# Patient Record
Sex: Female | Born: 1940 | Race: White | Hispanic: No | Marital: Married | Smoking: Former smoker
Health system: Southern US, Community
[De-identification: ages and names within clinical notes are randomized; demographics above are authoritative.]

## PROBLEM LIST (undated history)

## (undated) DIAGNOSIS — E7800 Pure hypercholesterolemia, unspecified: Secondary | ICD-10-CM

## (undated) DIAGNOSIS — H409 Unspecified glaucoma: Secondary | ICD-10-CM

## (undated) DIAGNOSIS — G20 Parkinson's disease: Secondary | ICD-10-CM

## (undated) DIAGNOSIS — N3281 Overactive bladder: Secondary | ICD-10-CM

## (undated) DIAGNOSIS — M1990 Unspecified osteoarthritis, unspecified site: Secondary | ICD-10-CM

## (undated) DIAGNOSIS — R251 Tremor, unspecified: Secondary | ICD-10-CM

## (undated) HISTORY — DX: Parkinson's disease: G20

## (undated) HISTORY — PX: EYE SURGERY: SHX253

## (undated) HISTORY — DX: Unspecified glaucoma: H40.9

## (undated) HISTORY — PX: CATARACT EXTRACTION, BILATERAL: SHX1313

## (undated) HISTORY — DX: Pure hypercholesterolemia, unspecified: E78.00

## (undated) HISTORY — PX: COSMETIC SURGERY: SHX468

## (undated) HISTORY — PX: OTHER SURGICAL HISTORY: SHX169

## (undated) HISTORY — PX: TUBAL LIGATION: SHX77

---

## 2018-05-04 ENCOUNTER — Telehealth: Payer: Self-pay | Admitting: Neurology

## 2018-05-04 ENCOUNTER — Other Ambulatory Visit: Payer: Self-pay

## 2018-05-04 ENCOUNTER — Ambulatory Visit (INDEPENDENT_AMBULATORY_CARE_PROVIDER_SITE_OTHER): Payer: Medicare Other | Admitting: Neurology

## 2018-05-04 ENCOUNTER — Encounter: Payer: Self-pay | Admitting: Neurology

## 2018-05-04 DIAGNOSIS — G20 Parkinson's disease: Secondary | ICD-10-CM | POA: Diagnosis not present

## 2018-05-04 MED ORDER — PRAMIPEXOLE DIHYDROCHLORIDE 0.125 MG PO TABS: ORAL_TABLET | ORAL | 2 refills | Status: DC

## 2018-05-04 NOTE — Patient Instructions (Signed)
We will start mirapex for the parkinson's disease.  Mirapex (pramipexole) may result in confusion or hallucinations, dizziness, drowsiness, or nausea. If any significant side effects are noted, please contact our office.

## 2018-05-04 NOTE — Progress Notes (Signed)
Reason for visit: Tremor  Referring physician: Dr. Marylyn Ishiharaesai  Jerrell Shelley Espinoza is a 77 y.o. female  History of present illness:  Ms. Shelley Espinoza is a 77 year old right-handed white female with a history of a tremor that just started after 28 Mar 2018.  The patient had been under some stress with a recent move and an illness of her husband.  The patient fell on 24 April 2018 as she was coming downhill, she felt as if her body was ahead of her feet and she could not catch up.  The patient bruised the left periorbital area and the left knee.  The patient did not undergo a CT scan of the head following the fall.  She has noted some problems with micrographia and sloppy handwriting since March 2019.  The patient denies any tremor on the left arm.  The right arm tremor is associated with resting, and goes away when she uses the arm.  She reports no numbness or weakness of the extremities.  She denies any problems with her speech or swallowing.  She does have a history of benign positional vertigo that may come and go over time.  She goes on to say that her brother has Parkinson's disease and dementia.  She is sent to this office for an evaluation.  Past Medical History:  Diagnosis Date  . High cholesterol     Past Surgical History:  Procedure Laterality Date  . CATARACT EXTRACTION, BILATERAL     age 77  . martins nueroma     feet    Family History  Problem Relation Age of Onset  . Stroke Mother   . Hypertension Father   . Heart disease Father   . Schizophrenia Sister   . Parkinsonism Brother     Social history:  reports that she has never smoked. She has never used smokeless tobacco. She reports that she drinks alcohol. She reports that she does not use drugs.  Medications:  Prior to Admission medications   Medication Sig Start Date End Date Taking? Authorizing Provider  Ascorbic Acid (SUPER C COMPLEX PO) Take 500 mg by mouth daily.   Yes [provider]  BIOTIN PO Take 5,000 mg  by mouth daily.   Yes [provider]  CALCIUM-VITAMIN D PO Take 2 tablets by mouth daily.   Yes [provider]  Cholecalciferol (VITAMIN D3 PO) Take 2,000 Units by mouth daily.   Yes [provider]  Cyanocobalamin (B-12 PO) Take 1,000 mcg by mouth daily.   Yes [provider]  fluticasone (CUTIVATE) 0.05 % cream Apply 1 application topically 2 (two) times daily as needed. 04/17/18  Yes [provider]  GLUCOSAMINE HCL PO Take 1,500 mg by mouth daily.   Yes [provider]  GLUCOSAMINE-FISH OIL-EPA-DHA PO Take 1 tablet by mouth daily. Fish oil 180mg , EPA 120mg , DHA   Yes [provider]  meloxicam (MOBIC) 15 MG tablet Take 7.5 mg by mouth daily.   Yes [provider]  Multiple Vitamins-Minerals (MULTIVITAMIN PO) Take 1 tablet by mouth daily.   Yes [provider]  psyllium (METAMUCIL) 58.6 % powder Take 1 packet by mouth as needed.   Yes [provider]  simvastatin (ZOCOR) 20 MG tablet Take 1 tablet by mouth every other day. 04/13/18  Yes [provider]  UNABLE TO FIND Take 1 Dose by mouth daily. Med Name: pro advanced formula 220   Yes [provider]  pramipexole (MIRAPEX) 0.125 MG tablet One tablet  three times a day for 2 weeks, then take 2 tablets three times a day 05/04/18   York Spaniel, MD     Not on File  ROS:  Out of a complete 14 system review of symptoms, the patient complains only of the following symptoms, and all other reviewed systems are negative.  Chills, fatigue Frequent waking, daytime sleepiness Incontinence of the bladder, frequency of urination, urinary urgency Walking difficulty Dizziness, tremors Confusion, decreased concentration, anxiety  Blood pressure 123/66, pulse 72, height 5\' 4"  (1.626 m), weight 167 lb (75.8 kg).  Physical Exam  General: The patient is alert and cooperative at the time of the examination.  Eyes: Pupils are equal, round, and  reactive to light. Discs are flat bilaterally.  Neck: The neck is supple, no carotid bruits are noted.  Respiratory: The respiratory examination is clear.  Cardiovascular: The cardiovascular examination reveals a regular rate and rhythm, no obvious murmurs or rubs are noted.  Skin: Extremities are without significant edema.  Ecchymosis around the left knee is seen.  Neurologic Exam  Mental status: The patient is alert and oriented x 3 at the time of the examination. The patient has apparent normal recent and remote memory, with an apparently normal attention span and concentration ability.  Cranial nerves: Facial symmetry is present. There is good sensation of the face to pinprick and soft touch bilaterally. The strength of the facial muscles and the muscles to head turning and shoulder shrug are normal bilaterally. Speech is well enunciated, no aphasia or dysarthria is noted. Extraocular movements are full. Visual fields are full. The tongue is midline, and the patient has symmetric elevation of the soft palate. No obvious hearing deficits are noted.  Ecchymosis around the left eye is seen.  Motor: The motor testing reveals 5 over 5 strength of all 4 extremities. Good symmetric motor tone is noted throughout.  Sensory: Sensory testing is intact to pinprick, soft touch, vibration sensation, and position sense on all 4 extremities. No evidence of extinction is noted.  Coordination: Cerebellar testing reveals good finger-nose-finger and heel-to-shin bilaterally.  A resting tremor involving the right upper extremity is seen.  Gait and station: The patient is able to stand from a sitting position with arms crossed.  Once up, the patient can walk independently with good stride and good turns.  There is decreased arm swing on the right.  Tandem gait is normal. Romberg is negative. No drift is seen.  Reflexes: Deep tendon reflexes are symmetric and normal bilaterally. Toes are downgoing  bilaterally.   Assessment/Plan:  1.  Parkinson's disease, right-sided features  The patient may have an inherited form of Parkinson's disease as her brother also has Parkinson's disease and dementia.  The patient had a recent fall with head trauma.  She will undergo MRI of the brain.  She will be placed on Mirapex beginning at 0.125 mg 3 times daily for 2 weeks and then go to 0.25 mg 3 times daily.  I will see her back in 5 months.  She is to remain active with exercise and walking.  Marlan Palau MD 05/04/2018 2:37 PM  [facility] [address] [place], [place] [address]  Phone [phone] Fax [phone]

## 2018-05-04 NOTE — Telephone Encounter (Signed)
[place]icare/[place] of [place] order sent to [facility]. They will reach out to the pt to schedule.  °

## 2018-05-06 ENCOUNTER — Encounter: Payer: Self-pay | Admitting: *Deleted

## 2018-05-06 ENCOUNTER — Telehealth: Payer: Self-pay | Admitting: Neurology

## 2018-05-06 NOTE — Telephone Encounter (Signed)
Called husband, Roe [place]Don ([place] back. He wanted to know if they should space mirapex dosing evenly throughout the day. He knows she needs to take 1 tablet TID for 2 weeks and then increase to 2 tabs there on after. I verified she should evenly space throughout the day. She gets up around 8am per husband.  Husband states wife has one alcoholic drink per night and wanted to make sure it was ok for her to continue this since adding mirapex to medication regimen. Advised I will verify with Dr. Anne HahnWillis and we will only call if she should not continue to have one alcoholic drink per night. If they do not hear from us, it is ok for her to continue. He verbalized understanding.

## 2018-05-06 NOTE — Telephone Encounter (Signed)
I called the patient.  I talk with husband.  The use of small amounts of alcohol is okay with the Mirapex, they can space the medications out to the day, do not have to be extremely specific about when the timing is when the medication is taken.

## 2018-05-06 NOTE — Telephone Encounter (Signed)
Pt husband Roe CoombsDon requesting a call stating he has a few question regarding medication pramipexole (MIRAPEX) 0.125 MG tablet. Roe CoombsDon did not wish to discuss with me, please call to advise

## 2018-05-18 ENCOUNTER — Ambulatory Visit
Admission: RE | Admit: 2018-05-18 | Discharge: 2018-05-18 | Disposition: A | Discharge status: Home / Self Care | Payer: Medicare Other | Source: Ambulatory Visit | Attending: Neurology | Admitting: Neurology

## 2018-05-18 DIAGNOSIS — G20 Parkinson's disease: Secondary | ICD-10-CM

## 2018-05-19 ENCOUNTER — Telehealth: Payer: Self-pay | Admitting: *Deleted

## 2018-05-19 NOTE — Telephone Encounter (Signed)
Called and spoke with patient about MRI results per Dr. Marjory LiesPenumalli note. Pt verbalized understanding. She will continue mirapex and keep upcoming f/u. She will call back if she has further questions/concerns.

## 2018-05-19 NOTE — Telephone Encounter (Signed)
-----   Message from Suanne MarkerVikram R Penumalli, MD sent at 05/19/2018 10:28 AM EDT ----- Unremarkable imaging results. Small spots of scar tissue. No other major findings. Please call patient. Continue current plan. -VRP

## 2018-06-08 ENCOUNTER — Telehealth: Payer: Self-pay | Admitting: Neurology

## 2018-06-08 NOTE — Telephone Encounter (Signed)
The patient has chronic constipation.  She is to go on docusate as a stool softener but then begin taking MiraLAX on a daily basis.  She may take Dulcolax if needed.

## 2018-06-08 NOTE — Telephone Encounter (Signed)
Pt is constipated and wants to know what she can take. Please call to advise

## 2018-08-10 ENCOUNTER — Other Ambulatory Visit: Payer: Self-pay | Admitting: Neurology

## 2018-08-26 ENCOUNTER — Telehealth: Payer: Self-pay | Admitting: Neurology

## 2018-08-26 NOTE — Telephone Encounter (Signed)
I called the patient.  The patient is having an increase in the tremors recently, the Mirapex will not help the tremor.  I have recommended that she take Benadryl at night to help her rest and to suppress the tremor, she may take 25 or 50 mg at night.

## 2018-08-26 NOTE — Telephone Encounter (Signed)
Spoke with Shelley Espinoza and confirmed that she is taking Mirapex, 2 tabs tid as rx'd.  Sts. right arm tremors are worse in the last 7-10 days with no obvious cause.  Tremors were so bad last night they kept her from sleeping. O/W she is feeling overall better--walking 3 times a wk, then going to the gym 3 times a wk as well/fim

## 2018-08-26 NOTE — Telephone Encounter (Signed)
Pt requesting a call stating for about 2 weeks her hand has gotten worse (uncontrolable shaking) stating last night it kept her awake all night long. Please advise

## 2018-10-12 ENCOUNTER — Telehealth: Payer: Self-pay | Admitting: Neurology

## 2018-10-12 NOTE — Telephone Encounter (Signed)
I called the patient.  The patient is still not sleeping well at 50 mg at night with Benadryl, the tremors continue to be a problem.  Unfortunately, Mirapex and Sinemet did not help the Parkinson's tremor much.  I do not wish to use an anticholinergic medication such as Artane or Cogentin, the patient is concerned about her memory.  The patient can go up to 75 or 100 mg of Benadryl in the evening, she can use one half of a 25 mg tablet 2 or 3 times during the day to help the tremor.

## 2018-10-12 NOTE — Telephone Encounter (Signed)
Patient states pramipexole (MIRAPEX) 0.125 MG tablet is not helping with tremors. She has taken Benadryl but it does not keep her asleep. She wakes up during the night with tremors and they are getting worse. She read about Levadopa helping with tremors and would like to discuss.

## 2018-11-09 ENCOUNTER — Other Ambulatory Visit: Payer: Self-pay | Admitting: Neurology

## 2018-11-18 ENCOUNTER — Ambulatory Visit (INDEPENDENT_AMBULATORY_CARE_PROVIDER_SITE_OTHER): Payer: Medicare Other | Admitting: Neurology

## 2018-11-18 ENCOUNTER — Encounter: Payer: Self-pay | Admitting: Neurology

## 2018-11-18 DIAGNOSIS — G20A1 Parkinson's disease without dyskinesia, without mention of fluctuations: Secondary | ICD-10-CM | POA: Insufficient documentation

## 2018-11-18 DIAGNOSIS — G20 Parkinson's disease: Secondary | ICD-10-CM | POA: Diagnosis not present

## 2018-11-18 HISTORY — DX: Parkinson's disease without dyskinesia, without mention of fluctuations: G20.A1

## 2018-11-18 HISTORY — DX: Parkinson's disease: G20

## 2018-11-18 MED ORDER — PRAMIPEXOLE DIHYDROCHLORIDE ER 1.5 MG PO TB24: 1.5000 mg | ORAL_TABLET | Freq: Every day | ORAL | 1 refills | Status: DC

## 2018-11-18 NOTE — Patient Instructions (Signed)
Stop the mirapex tablets, we will start the long acting mirapex 1.5 mg tablet once a day.

## 2018-11-18 NOTE — Progress Notes (Signed)
Reason for visit: Parkinson's disease  Shelley Espinoza is an 78 y.o. female  History of present illness:  Ms. Shelley Espinoza is a 78 year old right-handed white female with a history of Parkinson's disease mainly associated with a right upper extremity resting tremor, decreased arm swing on the right.  The patient otherwise has minimal features of parkinsonism, without significant masking of the face or significant postural changes.  The patient is been placed on Mirapex taking the 0.125 mg tablets, 3 tablets 3 times daily.  The patient tolerates the medication well without any drowsiness or nausea.  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  She tries to workout 3 times a week.  She has not had any falls.  She returns to this office for an evaluation.  She does not believe that there has been any change in her clinical condition since last seen.  Mild memory problems have been noted.  Past Medical History:  Diagnosis Date  . High cholesterol     Past Surgical History:  Procedure Laterality Date  . CATARACT EXTRACTION, BILATERAL     age 89  . martins nueroma     feet    Family History  Problem Relation Age of Onset  . Stroke Mother   . Hypertension Father   . Heart disease Father   . Schizophrenia Sister   . Parkinsonism Brother     Social history:  reports that she has never smoked. She has never used smokeless tobacco. She reports current alcohol use. She reports that she does not use drugs.   Not on File  Medications:  Prior to Admission medications   Medication Sig Start Date End Date Taking? Authorizing Provider  Ascorbic Acid (SUPER C COMPLEX PO) Take 500 mg by mouth daily.   Yes [provider]  BIOTIN PO Take 5,000 mg by mouth daily.   Yes [provider]  CALCIUM-VITAMIN D PO Take 2 tablets by mouth daily.   Yes [provider]  Cholecalciferol (VITAMIN D3 PO) Take 2,000  Units by mouth daily.   Yes [provider]  Cyanocobalamin (B-12 PO) Take 1,000 mcg by mouth daily.   Yes [provider]  GLUCOSAMINE HCL PO Take 1,500 mg by mouth daily.   Yes [provider]  GLUCOSAMINE-FISH OIL-EPA-DHA PO Take 1 tablet by mouth daily. Fish oil 180mg , EPA 120mg , DHA   Yes [provider]  meloxicam (MOBIC) 15 MG tablet Take 7.5 mg by mouth daily.   Yes [provider]  Multiple Vitamins-Minerals (MULTIVITAMIN PO) Take 1 tablet by mouth daily.   Yes [provider]  pramipexole (MIRAPEX) 0.125 MG tablet 2 TABLETS THREE TIMES DAILY 11/09/18  Yes York Spaniel, MD  simvastatin (ZOCOR) 20 MG tablet Take 1 tablet by mouth every other day. 04/13/18  Yes [provider]  UNABLE TO FIND Take 1 Dose by mouth daily. Med Name: pro advanced formula 220   Yes [provider]    ROS:  Out of a complete 14 system review of symptoms, the patient complains only of the following symptoms, and all other reviewed systems are negative.  Constipation Incontinence of the bladder Memory disturbance Tremors  Blood pressure 115/67, pulse 69, height 5\' 4"  (1.626 m), weight 172 lb (78 kg).  Physical Exam  General: The patient is alert and cooperative at the time of the examination.  Skin: No significant peripheral edema is noted.  Neurologic Exam  Mental status: The patient is alert and oriented x 3 at the time of the examination. The patient has apparent normal recent and remote memory, with an apparently normal attention span and concentration ability.   Cranial nerves: Facial symmetry is present. Speech is normal, no aphasia or dysarthria is noted. Extraocular movements are full. Visual fields are full.  Motor: The patient has good strength in all 4 extremities.  Sensory examination: Soft touch sensation is symmetric on the face, arms, and legs.  Coordination: The patient has good finger-nose-finger and  heel-to-shin bilaterally.  A prominent resting tremor on the right arm is noted.  Gait and station: The patient has a normal gait, the patient has decreased arm swing on the right and tremor is seen on the right arm. Tandem gait is normal. Romberg is negative. No drift is seen.  Reflexes: Deep tendon reflexes are symmetric.   MRI brain 05/18/18:  IMPRESSION: This MRI of the brain without contrast shows the following: 1.    Some scattered T2/FLAIR hyperintense foci in the subcortical deep white matter consistent with mild chronic microvascular ischemic changes.  None of these appear to be acute. 2.    Mild generalized cortical atrophy. 3.    There are no acute findings.  * MRI scan images were reviewed online. I agree with the written report.    Assessment/Plan:  1.  Parkinson's disease, right-sided features  2.  Mild memory disturbance  The patient is overall doing fairly well on the Mirapex, she wishes to switch to the long-acting preparation, I will send in the 1.5 mg Mirapex tablet.  She will follow-up in 6 months.  She seems to be progressing very slowly.   Marlan Palau MD 11/18/2018 10:26 AM  [facility] [address] [place], [place] [address]  Phone [phone] Fax [phone]

## 2018-11-30 ENCOUNTER — Telehealth: Payer: Self-pay | Admitting: Neurology

## 2018-11-30 MED ORDER — PRAMIPEXOLE DIHYDROCHLORIDE 0.5 MG PO TABS: 0.5000 mg | ORAL_TABLET | Freq: Three times a day (TID) | ORAL | 1 refills | Status: DC

## 2018-11-30 NOTE — Addendum Note (Signed)
Addended by: York Spaniel on: 11/30/2018 10:30 AM   Modules accepted: Orders

## 2018-11-30 NOTE — Telephone Encounter (Signed)
I will go ahead and switch the patient back to the Mirapex 0.5 mg 1 tablet taking 1 tablet 3 times daily.

## 2018-11-30 NOTE — Telephone Encounter (Signed)
Pt states the Pramipexole Dihydrochloride (MIRAPEX ER) 1.5 MG TB24 is not covered by the ins. Pt is needing the 0.125mg  tabs RX sent back to [facility] in [place] since that's the one that is covered by the insurance. Please advise.

## 2019-03-01 ENCOUNTER — Ambulatory Visit (INDEPENDENT_AMBULATORY_CARE_PROVIDER_SITE_OTHER): Payer: Medicare Other | Admitting: Internal Medicine

## 2019-03-02 ENCOUNTER — Other Ambulatory Visit: Payer: Self-pay

## 2019-03-02 ENCOUNTER — Ambulatory Visit (INDEPENDENT_AMBULATORY_CARE_PROVIDER_SITE_OTHER): Payer: Medicare Other | Admitting: Internal Medicine

## 2019-03-02 ENCOUNTER — Encounter (INDEPENDENT_AMBULATORY_CARE_PROVIDER_SITE_OTHER): Payer: Self-pay | Admitting: Internal Medicine

## 2019-03-02 VITALS — BP 112/72 | HR 80 | Temp 98.1°F | Ht 64.0 in | Wt 167.6 lb

## 2019-03-02 DIAGNOSIS — R748 Abnormal levels of other serum enzymes: Secondary | ICD-10-CM

## 2019-03-02 DIAGNOSIS — K862 Cyst of pancreas: Secondary | ICD-10-CM | POA: Insufficient documentation

## 2019-03-02 NOTE — Patient Instructions (Signed)
CMET today. Recall MRI abdomen w/wo CM in 6 months. OV 6 months.

## 2019-03-02 NOTE — Progress Notes (Addendum)
Subjective:    Patient ID: Shelley Espinoza, female    DOB: [birth year], 78 y.o.   MRN: [id number]  HPI Referred by Dr. Heide Guile for pancreatic mass, elevated liver enzymes. She has never had pancreatitis. She drinks 1-2 glasses of wine a night with her family. No hx of IV drugs. Her appetite is good. No weight loss.  BMs are normal.No melena or BRRB.  Hx of chronic constipation.  She has no abdominal pain.   3/9?2020 H and H 14.4 and 45.8, Platelet ct 254, Total bili 0.4, ASAT 41, ALT 107, ALP 104   02/17/2019 MRI abdomen w/wo CM: Pancreatic mass Impression: Findings appreciated on recent US within the head/uncinate process region of the pancreas with MR characteristics consistent with a cyst. This finding is in continuation with the pancreatic duct just prior to insertion into the ampulla and either abuts the document or possibly arise from the duct. Differential considerations include a benign cystic mass within the pancreas possibly pseudocysts. Patient has hx of pancreatitis. An etiology such as as a IMPT cannot be excluded and repeat surveillance evaluation in 6 monhts with MRI is recommended.     Review of Systems Past Medical History:  Diagnosis Date  . High cholesterol   . Parkinson's disease (HCC) 11/18/2018    Past Surgical History:  Procedure Laterality Date  . CATARACT EXTRACTION, BILATERAL     age 78  . martins nueroma     feet    No Known Allergies  Current Outpatient Medications on File Prior to Visit  Medication Sig Dispense Refill  . Ascorbic Acid (SUPER C COMPLEX PO) Take 500 mg by mouth daily.    Marland Kitchen BIOTIN PO Take 5,000 mg by mouth daily.    Marland Kitchen CALCIUM-VITAMIN D PO Take 2 tablets by mouth daily.    . Cholecalciferol (VITAMIN D3 PO) Take 2,000 Units by mouth daily.    . Cyanocobalamin (B-12 PO) Take 1,000 mcg by mouth daily.    Marland Kitchen docusate sodium (COLACE) 100 MG capsule Take 100 mg by mouth. Four times a week    . GLUCOSAMINE HCL PO Take 1,500 mg by mouth daily.     Marland Kitchen GLUCOSAMINE-FISH OIL-EPA-DHA PO Take 1 tablet by mouth daily. Fish oil 180mg , EPA 120mg , DHA    . meloxicam (MOBIC) 15 MG tablet Take 7.5 mg by mouth daily.    . Multiple Vitamins-Minerals (MULTIVITAMIN PO) Take 1 tablet by mouth daily.    . pramipexole (MIRAPEX) 0.5 MG tablet Take 1 tablet (0.5 mg total) by mouth 3 (three) times daily. 270 tablet 1  . Probiotic Product (PROBIOTIC-10 PO) Take by mouth.    . simvastatin (ZOCOR) 20 MG tablet Take 1 tablet by mouth every other day.  5  . UNABLE TO FIND Take 1 Dose by mouth daily. Med Name: pro advanced formula 220     No current facility-administered medications on file prior to visit.         Objective:   Physical Exam Blood pressure 112/72, pulse 80, temperature 98.1 F (36.7 C), height 5\' 4"  (1.626 m), weight 167 lb 9.6 oz (76 kg). Alert and oriented. Skin warm and dry. Oral mucosa is moist.   . Sclera anicteric, conjunctivae is pink. Thyroid not enlarged. No cervical lymphadenopathy. Lungs clear. Heart regular rate and rhythm.  Abdomen is soft. Bowel sounds are positive. No hepatomegaly. No abdominal masses felt. No tenderness.  No edema to lower extremities. .        Assessment & Plan:  Pancreatic  cyst. Will put on a recall for 6 months MRI abdomen w/wo CM. Will have CD she brought in over read.  Elevated liver enzymes: Will recheck liver enzymes.  OV in 6 months.

## 2019-03-03 LAB — COMPREHENSIVE METABOLIC PANEL
AG Ratio: 2.1 (calc) (ref 1.0–2.5)
ALT: 44 U/L — ABNORMAL HIGH (ref 6–29)
AST: 30 U/L (ref 10–35)
Albumin: 4.4 g/dL (ref 3.6–5.1)
Alkaline phosphatase (APISO): 67 U/L (ref 37–153)
BUN: 14 mg/dL (ref 7–25)
CO2: 28 mmol/L (ref 20–32)
Calcium: 10.4 mg/dL (ref 8.6–10.4)
Chloride: 104 mmol/L (ref 98–110)
Creat: 0.7 mg/dL (ref 0.60–0.93)
Globulin: 2.1 g/dL (calc) (ref 1.9–3.7)
Glucose, Bld: 81 mg/dL (ref 65–139)
Potassium: 4.4 mmol/L (ref 3.5–5.3)
Sodium: 141 mmol/L (ref 135–146)
Total Bilirubin: 0.6 mg/dL (ref 0.2–1.2)
Total Protein: 6.5 g/dL (ref 6.1–8.1)

## 2019-03-09 ENCOUNTER — Telehealth (INDEPENDENT_AMBULATORY_CARE_PROVIDER_SITE_OTHER): Payer: Self-pay | Admitting: Internal Medicine

## 2019-03-09 NOTE — Telephone Encounter (Signed)
Shelley Espinoza, OV in 3 months 

## 2019-03-09 NOTE — Telephone Encounter (Signed)
Patient called stated you talked to her about her liver- would like to talk to you about her pancreas - please call [phone]

## 2019-03-09 NOTE — Telephone Encounter (Signed)
I talked with patient. Will see back in 3 months. MRI in 6 months.

## 2019-05-31 ENCOUNTER — Telehealth (INDEPENDENT_AMBULATORY_CARE_PROVIDER_SITE_OTHER): Payer: Self-pay | Admitting: Internal Medicine

## 2019-05-31 NOTE — Telephone Encounter (Signed)
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  She wants it done at [facility].

## 2019-06-01 ENCOUNTER — Telehealth (INDEPENDENT_AMBULATORY_CARE_PROVIDER_SITE_OTHER): Payer: Self-pay | Admitting: Internal Medicine

## 2019-06-01 NOTE — Telephone Encounter (Signed)
I put an order in the MRI. Needs to be cancelled.  She needs a surveillance MRI abdomen w/wo CM in October. I will let patient know.

## 2019-06-01 NOTE — Telephone Encounter (Signed)
MRI noted in recall 

## 2019-06-03 ENCOUNTER — Ambulatory Visit (INDEPENDENT_AMBULATORY_CARE_PROVIDER_SITE_OTHER): Payer: Medicare Other | Admitting: Neurology

## 2019-06-03 ENCOUNTER — Encounter: Payer: Self-pay | Admitting: Neurology

## 2019-06-03 ENCOUNTER — Other Ambulatory Visit: Payer: Self-pay

## 2019-06-03 VITALS — BP 122/70 | HR 64 | Temp 98.6°F | Ht 64.0 in | Wt 175.1 lb

## 2019-06-03 DIAGNOSIS — R413 Other amnesia: Secondary | ICD-10-CM

## 2019-06-03 DIAGNOSIS — E538 Deficiency of other specified B group vitamins: Secondary | ICD-10-CM

## 2019-06-03 DIAGNOSIS — G20 Parkinson's disease: Secondary | ICD-10-CM | POA: Diagnosis not present

## 2019-06-03 MED ORDER — PRAMIPEXOLE DIHYDROCHLORIDE 0.5 MG PO TABS: 0.5000 mg | ORAL_TABLET | Freq: Three times a day (TID) | ORAL | 1 refills | Status: DC

## 2019-06-03 NOTE — Progress Notes (Signed)
Reason for visit: Parkinson's disease  Shelley Espinoza is an 78 y.o. female  History of present illness:  Shelley Espinoza is a 77 year old right-handed white female with a history of Parkinson's disease mainly associated with the right upper extremity tremor.  The patient has not noted any significant change in mobility or rapidity of movement.  She has an arthritic right hip, she has stiffness in the right leg because of this.  She has been told that she may need a total hip replacement.  The patient is not walking as much as she should because of the hip.  She may only walk 15 minutes at a time.  She has not had any falls, she reports no difficulty getting up out of a chair or walking upstairs.  The patient has a significant tremor involving the right upper extremity, she does note micrographia with handwriting.  The patient takes Benadryl in the evening for sleep.  She has reported some issues with a mild memory problem, she is having word finding troubles.  Past Medical History:  Diagnosis Date  . High cholesterol   . Parkinson's disease ([place]) 11/18/2018    Past Surgical History:  Procedure Laterality Date  . CATARACT EXTRACTION, BILATERAL     age 81  . martins nueroma     feet    Family History  Problem Relation Age of Onset  . Stroke Mother   . Hypertension Father   . Heart disease Father   . Schizophrenia Sister   . Parkinsonism Brother     Social history:  reports that she has never smoked. She has never used smokeless tobacco. She reports current alcohol use. She reports that she does not use drugs.   No Known Allergies  Medications:  Prior to Admission medications   Medication Sig Start Date End Date Taking? Authorizing Provider  Ascorbic Acid (SUPER C COMPLEX PO) Take 500 mg by mouth daily.   Yes [provider]  BIOTIN PO Take 5,000 mg by mouth daily.   Yes [provider]  CALCIUM-VITAMIN D PO Take 2 tablets by mouth daily.   Yes [provider]  Cholecalciferol (VITAMIN D3 PO) Take 2,000 Units by mouth daily.   Yes [provider]  Cyanocobalamin (B-12 PO) Take 1,000 mcg by mouth daily.   Yes [provider]  diphenhydrAMINE HCl (BENADRYL PO) Take by mouth.   Yes [provider]  docusate sodium (COLACE) 100 MG capsule Take 100 mg by mouth. Four times a week   Yes [provider]  GLUCOSAMINE HCL PO Take 1,500 mg by mouth daily.   Yes [provider]  [place] OIL-EPA-DHA PO Take 1 tablet by mouth daily. Fish oil 180mg , EPA 120mg , DHA   Yes [provider]  meloxicam (MOBIC) 15 MG tablet Take 7.5 mg by mouth daily.   Yes [provider]  Multiple Vitamins-Minerals (MULTIVITAMIN PO) Take 1 tablet by mouth daily.   Yes [provider]  pramipexole (MIRAPEX) 0.5 MG tablet Take 1 tablet (0.5 mg total) by mouth 3 (three) times daily. 11/30/18  Yes Kathrynn Ducking, MD  Probiotic Product (PROBIOTIC-10 PO) Take by mouth.   Yes [provider]  simvastatin (ZOCOR) 20 MG tablet Take 1 tablet by mouth every other day. 04/13/18  Yes [provider]  UNABLE TO FIND Take 1 Dose by mouth daily. Med Name: pro advanced formula 220   Yes [provider]    ROS:  Out of a complete  14 system review of symptoms, the patient complains only of the following symptoms, and all other reviewed systems are negative.  Memory problems Tremor Hip pain  Blood pressure 122/70, pulse 64, temperature 98.6 F (37 C), temperature source Temporal, height 5\' 4"  (1.626 m), weight 175 lb 2 oz (79.4 kg), SpO2 94 %.  Physical Exam  General: The patient is alert and cooperative at the time of the examination.  Skin: No significant peripheral edema is noted.   Neurologic Exam  Mental status: The patient is alert and oriented x 3 at the time of the examination. The Mini-Mental status examination done today shows a total score of 27/30.    Cranial nerves: Facial symmetry is present. Speech is normal, no aphasia or dysarthria is noted. Extraocular movements are full. Visual fields are full.  Motor: The patient has good strength in all 4 extremities.  Sensory examination: Soft touch sensation is symmetric on the face, arms, and legs.  Coordination: The patient has good finger-nose-finger and heel-to-shin bilaterally.  A prominent resting tremor is noted with the right upper extremity.  Gait and station: The patient has a normal gait, with exception that there is a prominent tremor with the right arm with walking, decreased arm swing.  The patient is able to get up from a seated position with arms crossed easily. Tandem gait is normal. Romberg is negative. No drift is seen.  Reflexes: Deep tendon reflexes are symmetric.   Assessment/Plan:  1.  Parkinson's disease, right-sided features  2.  Reported mild memory disturbance  We will check blood work today and follow the memory issues over time.  The patient will remain on the Mirapex 0.5 mg 3 times daily.  At some point in the future we may add Sinemet.  She may take half of a Benadryl tablet during the day to see if this helps her tremor.  She will follow-up in 5 to 6 months.  Marlan Palau. Keith Bensyn Bornemann MD 06/03/2019 10:50 AM  [facility] [address] [place] [id number]  Phone [phone] Fax [phone]

## 2019-06-04 LAB — VITAMIN B12: Vitamin B-12: 1249 pg/mL — ABNORMAL HIGH (ref 232–1245)

## 2019-06-04 LAB — SEDIMENTATION RATE: Sed Rate: 6 mm/hr (ref 0–40)

## 2019-06-04 LAB — RPR: RPR Ser Ql: NONREACTIVE

## 2019-06-08 ENCOUNTER — Telehealth: Payer: Self-pay

## 2019-06-08 NOTE — Telephone Encounter (Signed)
I reached out to the pt and was able to advise on the lab results. She verbalized understanding.

## 2019-06-08 NOTE — Telephone Encounter (Signed)
-----   Message from Kathrynn Ducking, MD sent at 06/04/2019  7:50 AM EDT -----  The blood work results are unremarkable. Please call the patient. ----- Message ----- From: Lavone Neri Lab Results In Sent: 06/04/2019   5:38 AM EDT To: Kathrynn Ducking, MD

## 2019-06-10 ENCOUNTER — Encounter (INDEPENDENT_AMBULATORY_CARE_PROVIDER_SITE_OTHER): Payer: Self-pay | Admitting: Nurse Practitioner

## 2019-06-10 ENCOUNTER — Other Ambulatory Visit: Payer: Self-pay

## 2019-06-10 ENCOUNTER — Ambulatory Visit (INDEPENDENT_AMBULATORY_CARE_PROVIDER_SITE_OTHER): Payer: Medicare Other | Admitting: Nurse Practitioner

## 2019-06-10 VITALS — BP 108/72 | HR 83 | Temp 97.5°F | Resp 18 | Ht 64.0 in | Wt 174.8 lb

## 2019-06-10 DIAGNOSIS — K862 Cyst of pancreas: Secondary | ICD-10-CM | POA: Diagnosis not present

## 2019-06-10 NOTE — Progress Notes (Addendum)
Subjective:    Patient ID: Shelley Espinoza, female    DOB: Oct 03, 1941, 78 y.o.   MRN: [id number]  HPI  Shelley Espinoza is a 78 y.o. female with a history of Parkinson's  disease and hypercholesterolemia. She presents today for follow up, reports she is due for a follow up abdominal MRI this fall. She was seen in office by Deberah Castle NP on 03/02/2019 for further evaluation regarding mildly elevated LFTs and an abnormal sonogram 01/29/2019 which showed a mass in the head of the pancreas.   An abdominal MRI was done 4/15/202: a 1.8 cm nodule within the head/uncinate process region of the pancreas with MR characteristics consistent with a cyst. This finding is in continuation with the pancreatic duct just prior to insertion into the ampulla and either abuts the document or possibly arise from the duct. Differential considerations include a benign cystic mass within the pancreas possibly pseudocysts. Patient has hx of pancreatitis. An etiology such as as a IMPT cannot be excluded and repeat surveillance evaluation in 6 months with MRI is recommended.  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  The patient was advised to repeat an abdominal MRI in 6 months.  Currently, she feels well. She has Parkinson's tremor to her upper extremities. Appetite is good. She has gained 9-10 lbs over the past year. She has constipation. No rectal bleeding. She reported having a normal colonoscopy at the age of 40 and was told she didn't need another one. No prior history of colon polyps.   LFTS 03/02/2019 AST 30. ALT 44.  Past Medical History:  Diagnosis Date  . High cholesterol   . Parkinson's disease ([place]) 11/18/2018   Past Surgical History:  Procedure Laterality Date  . CATARACT EXTRACTION, BILATERAL     age 34  . martins nueroma     feet   Current Outpatient Medications on File Prior to Visit  Medication Sig Dispense Refill  . Ascorbic Acid (SUPER C COMPLEX PO) Take 500 mg by mouth daily.    Marland Kitchen  BIOTIN PO Take 5,000 mg by mouth daily.    [place] CALCIUM-VITAMIN D PO Take 2 tablets by mouth daily.    . Cholecalciferol (VITAMIN D3 PO) Take 2,000 Units by mouth daily.    . Cyanocobalamin (B-12 PO) Take 1,000 mcg by mouth daily.    . diphenhydrAMINE HCl (BENADRYL PO) Take by mouth. Patient takes 2 by mouth Q HS to help with sleep.    Marland Kitchen docusate sodium (COLACE) 100 MG capsule Take 100 mg by mouth. Four times a week    . GLUCOSAMINE HCL PO Take 1,500 mg by mouth daily.    Marland Kitchen GLUCOSAMINE-FISH OIL-EPA-DHA PO Take 1 tablet by mouth daily. Fish oil 180mg , EPA 120mg , DHA    . meloxicam (MOBIC) 15 MG tablet Take 7.5 mg by mouth daily.    . Multiple Vitamins-Minerals (MULTIVITAMIN PO) Take 1 tablet by mouth daily.    . pramipexole (MIRAPEX) 0.5 MG tablet Take 1 tablet (0.5 mg total) by mouth 3 (three) times daily. 270 tablet 1  . Probiotic Product (PROBIOTIC-10 PO) Take by mouth daily.     . simvastatin (ZOCOR) 20 MG tablet Take 1 tablet by mouth every other day.  5  . UNABLE TO FIND Take 1 Dose by mouth daily. Med Name: pro advanced formula 220     No current facility-administered medications on file prior to visit.    No Known Allergies  Review of Systems  See HPI all  other systems reviewed and are negative     Objective:   Physical Exam  Blood pressure 108/72, pulse 83, temperature (!) 97.5 F (36.4 C), temperature source Oral, resp. rate 18, height 5\' 4"  (1.626 m), weight 174 lb 12.8 oz (79.3 kg).  General: 78 y.o. female well developed in NAD Eyes: sclera nonicteric, conjunctiva pink Neck: supple Heart: RRR, no murmur Lungs: clear throughout Abdomen: soft, nontender, no masses or organomegaly Extremities: no edema Neuro: alert and oriented, upper extremities with Parkinson's tremor    Assessment & Plan:   1. 78 y.o. 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 Karilyn Cotaehman   2. Constipation -Miralax daily -may take Dulcolax 1 tab by mouth once every 3 days as needed   Patient's history reviewed along with MRI result. Pancreatic lesion is 18 mm.  Therefore would agree with EUS with FNA if indicated to make a definite diagnosis and hopefully she will not need anymore follow-up imaging studies.

## 2019-06-10 NOTE — Patient Instructions (Signed)
1. I discussed scheduling an endoscopic ultrasound to further evaluate your pancrease verses repeating an abdominal MRI. I will consult with Dr. Laural Golden and I will contact you with our plan and recommendations  2. Miralax 1 capful mixed in 8 ounces of water at bed time for constipation, may also take Dulcolax 1 tab by mouth every third night if needed

## 2019-06-14 ENCOUNTER — Encounter (INDEPENDENT_AMBULATORY_CARE_PROVIDER_SITE_OTHER): Payer: Self-pay

## 2019-06-14 ENCOUNTER — Telehealth: Payer: Self-pay

## 2019-06-14 NOTE — Telephone Encounter (Signed)
I think so maybe [facility] can help you [phone] try that number. Sorry I could not help.

## 2019-06-14 NOTE — Telephone Encounter (Signed)
Dr Rush Landmark the images will be pushed thru [facility] today

## 2019-06-14 NOTE — Telephone Encounter (Signed)
I will await the images and review later today versus tomorrow. Thank you. GM

## 2019-06-14 NOTE — Telephone Encounter (Signed)
NO worries. I called [facility] and they are going to push it through. Let me know if any issues. Thanks

## 2019-06-14 NOTE — Telephone Encounter (Signed)
-----   Message from Irving Copas., MD sent at 06/14/2019  9:57 AM EDT ----- Regarding: RE: pancreatic EUS [name] and [name], Thanks for reaching out to [place]. [name], let's get the imaging from [facility] uploaded into [facility] to look at. Sounds like the possibility of a BD-IPMN vs querying a pseudocyst. As it sounds that she is doing well based on [name]'s note, my preference would be to review the imaging itself, either [name] or I, and if she is continuing to do well, start by repeating her scan in 60-months with MRI/MRCP and see if anything has changed.  If small then possibly a psuedocyst. However, if the history of pancreatitis is unclear and there is concern that this could be an etiology for pancreatitis in the setting of a possible symptomatic pancreatic cyst then we should consider EUS. Let's get imaging. Thoughts about the etiology of her pancreatitis - is there concern this could be an inciting event? Do we know of any prior imaging that she has had. [name], once you have the imaging uploaded please reach back out to [facility] or I, we'll be on the lookout. Thanks. [name] ----- Message ----- From: Timothy Lasso, RN Sent: 06/14/2019   8:22 AM EDT To: Milus Banister, MD, # Subject: FW: pancreatic EUS                              ----- Message ----- From: Worthy Keeler Sent: 06/14/2019   8:07 AM EDT To: Timothy Lasso, RN Subject: pancreatic EUS                                 Per Dr Laural Golden, patient need pancreatic EUS for pancreatic mass -- her MRI (02/17/19) & [place] (01/29/19) have been scanned in to [facility] and office note is in [place] -- let me know if any questions. Thanks

## 2019-06-14 NOTE — Telephone Encounter (Signed)
I'm don't know how to do that. Do I just call [facility] and ask them to upload to [facility]

## 2019-06-14 NOTE — Telephone Encounter (Signed)
[name], See the message from Dr Rush Landmark.  He would like the actual images from [place] uploaded into [facility].

## 2019-06-15 NOTE — Telephone Encounter (Signed)
Patty and [name], I logged into the chart and I am not seeing the imaging studies as of yet. He reach out to radiology tomorrow and check on the images? Please shoot me a message after you have heard from radiology. Thank you. GM

## 2019-06-16 NOTE — Telephone Encounter (Signed)
I spoke to [place] at [facility] and he has pushed the MRI & [place] to patients timeline and you should be able to see them. Let me know if any issues. Thanks

## 2019-06-17 ENCOUNTER — Other Ambulatory Visit (INDEPENDENT_AMBULATORY_CARE_PROVIDER_SITE_OTHER): Payer: Self-pay | Admitting: Nurse Practitioner

## 2019-06-17 DIAGNOSIS — K862 Cyst of pancreas: Secondary | ICD-10-CM

## 2019-06-17 NOTE — Telephone Encounter (Signed)
Dr. Britta Mccreedy Rady's recommendations noted. Patient is to have MRCP repeated in 6 months.

## 2019-06-17 NOTE — Telephone Encounter (Signed)
I called patient and explained the plan for Abdominal MRI with MRCP w/wo contrast to be done at [facility] and not her [facility] radiology facility due Oct. 2020. Pt to have BMP done within 1 week prior to  MRI to make sure her kidney function is normal. Lelon Frohlich will call patient to facilitate the labs and scheduling the MRI/MRCP

## 2019-06-17 NOTE — Telephone Encounter (Signed)
Thanks for getting the imaging pushed through [facility]. I have had an opportunity to review the imaging. The last imaging was in 4/20. The cystic appearing mass may be amenable to FNA/FNB but it would be most ideal for updated imaging to see what has changed if any. Since the patient is doing well, I would like the MRI to be repeated at the 26-month interval that was previously indicated (So in October would work) and ideally done at one of the [facility] centers, we can determine need for EUS/FNA-FNB. I would order this as a MRI/MRCP with/without contrast. Once this has been completed, NP Kennedy-Smith and Dr. Laural Golden can let Linna Hoff and I know and then we can decide on EUS timing. Thank you. GM

## 2019-06-21 ENCOUNTER — Encounter (INDEPENDENT_AMBULATORY_CARE_PROVIDER_SITE_OTHER): Payer: Self-pay | Admitting: *Deleted

## 2019-07-26 ENCOUNTER — Telehealth: Payer: Self-pay | Admitting: Neurology

## 2019-07-26 MED ORDER — CARBIDOPA-LEVODOPA 25-100 MG PO TABS: ORAL_TABLET | ORAL | 3 refills | Status: DC

## 2019-07-26 NOTE — Telephone Encounter (Signed)
Pt called stating that she is needing a higher dosage on her pramipexole (MIRAPEX) 0.5 MG tablet if not Pt states she is ready to start the carbidopa levodopa. Please advise.

## 2019-07-26 NOTE — Telephone Encounter (Signed)
I called the patient.  The patient has had a significant increase in the tremor of the right upper extremity.  She is not sleeping well at night because of this, even with the Benadryl.  I indicated that the medications for Parkinson disease generally do not improve tremor that much, but we will initiate treatment.  She will start on Sinemet 25/100 mg, she will take 1/2 tablet 3 times daily for 3 weeks then go to 1 full tablet 3 times daily.

## 2019-08-06 ENCOUNTER — Encounter (INDEPENDENT_AMBULATORY_CARE_PROVIDER_SITE_OTHER): Payer: Self-pay | Admitting: *Deleted

## 2019-08-17 LAB — BASIC METABOLIC PANEL WITH GFR
BUN: 17 mg/dL (ref 7–25)
CO2: 29 mmol/L (ref 20–32)
Calcium: 9.4 mg/dL (ref 8.6–10.4)
Chloride: 104 mmol/L (ref 98–110)
Creat: 0.77 mg/dL (ref 0.60–0.88)
GFR, Est African American: 85 mL/min/{1.73_m2} (ref >=60)
GFR, Est Non African American: 73 mL/min/{1.73_m2} (ref >=60)
Glucose, Bld: 83 mg/dL (ref 65–139)
Potassium: 4.1 mmol/L (ref 3.5–5.3)
Sodium: 138 mmol/L (ref 135–146)

## 2019-08-19 ENCOUNTER — Other Ambulatory Visit (INDEPENDENT_AMBULATORY_CARE_PROVIDER_SITE_OTHER): Payer: Self-pay | Admitting: Nurse Practitioner

## 2019-08-19 ENCOUNTER — Other Ambulatory Visit: Payer: Self-pay

## 2019-08-19 ENCOUNTER — Ambulatory Visit (HOSPITAL_COMMUNITY)
Admission: RE | Admit: 2019-08-19 | Discharge: 2019-08-19 | Disposition: A | Discharge status: Home / Self Care | Payer: Medicare Other | Source: Ambulatory Visit | Attending: Nurse Practitioner | Admitting: Nurse Practitioner

## 2019-08-19 DIAGNOSIS — K862 Cyst of pancreas: Secondary | ICD-10-CM

## 2019-08-19 MED ORDER — GADOBUTROL 1 MMOL/ML IV SOLN
7.5000 mL | Freq: Once | INTRAVENOUS | Status: AC | PRN
  Administered 2019-08-19: 7.5 mL via INTRAVENOUS

## 2019-08-31 ENCOUNTER — Telehealth (INDEPENDENT_AMBULATORY_CARE_PROVIDER_SITE_OTHER): Payer: Self-pay | Admitting: Nurse Practitioner

## 2019-08-31 NOTE — Telephone Encounter (Signed)
Patient left voice mail message stating she would like to get the results from her test done on 10/15  -  Ph# [phone]

## 2019-08-31 NOTE — Telephone Encounter (Signed)
I called pt and discussed her MRI/MRCP did not show any significant change in pancreatic cyst, repeat abd MRI/MRCP in 6 to 12 months. Dr. Rush Landmark advised repeat MRI/MRCP in 1 year. Patient would like Dr. Olevia Perches input as well.   Dr. Laural Golden, please review result note from Dr. Rush Landmark. If you are in agreement for repeat imaging in 1 year let me know so I can make sure recall is entered thx

## 2019-09-06 NOTE — Telephone Encounter (Signed)
OV Noted in recall for 1 year

## 2019-09-06 NOTE — Telephone Encounter (Signed)
MR reviewed. Pancreatic cyst measures 19 mm and essentially unchanged from study of April 2020 when it measured 18 mm. He also has hepatic cysts. I went over the results with patient over the phone. I would recommend repeating an MRCP in 1 year. Would also arrange for office visit in 1 year prior to scheduling MRCP.  Patient told me that she would prefer to have the study in [place]. Please forward copy of this report to Dr. Shearon Stalls.

## 2019-09-27 NOTE — H&P (Signed)
TOTAL HIP ADMISSION H&P  Patient is admitted for right total hip arthroplasty, anterior approach.  Subjective:  Chief Complaint: Right hip primary OA / pain  HPI: Shelley Espinoza, 78 y.o. female, has a history of pain and functional disability in the right hip(s) due to arthritis and patient has failed non-surgical conservative treatments for greater than 12 weeks to include NSAID's and/or analgesics, corticosteriod injections, use of assistive devices and activity modification.  Onset of symptoms was gradual starting 10 years ago with gradually worsening course since that time.The patient noted no past surgery on the right hip(s).  Patient currently rates pain in the right hip at 5 out of 10 with activity. Patient has worsening of pain with activity and weight bearing, trendelenberg gait, pain that interfers with activities of daily living and pain with passive range of motion. Patient has evidence of periarticular osteophytes and joint space narrowing by imaging studies. This condition presents safety issues increasing the risk of falls.   There is no current active infection.   Risks, benefits and expectations were discussed with the patient.  Risks including but not limited to the risk of anesthesia, blood clots, nerve damage, blood vessel damage, failure of the prosthesis, infection and up to and including death.  Patient understand the risks, benefits and expectations and wishes to proceed with surgery.   PCP: Aggie Cosier, MD  D/C Plans:       Home   Post-op Meds:       No Rx given   Tranexamic Acid:      To be given - IV   Decadron:      Is to be given  FYI:      ASA  [employer]  DME:   Pt already has equipment   PT:   HEP  Ph[facility] [address] [place], [place]    Patient Active Problem List   Diagnosis Date Noted  . Elevated liver enzymes 03/02/2019  . Pancreatic cyst 03/02/2019  . Parkinson's disease ([facility]) 11/18/2018   Past Medical History:  Diagnosis Date  . High cholesterol    . Parkinson's disease ([facility]) 11/18/2018    Past Surgical History:  Procedure Laterality Date  . CATARACT EXTRACTION, BILATERAL     age 7  . martins nueroma     feet    No current facility-administered medications for this encounter.    Current Outpatient Medications  Medication Sig Dispense Refill Last Dose  . Ascorbic Acid (SUPER C COMPLEX PO) Take 500 mg by mouth daily.   Taking  . BIOTIN PO Take 5,000 mg by mouth daily.   Taking  . CALCIUM-VITAMIN D PO Take 2 tablets by mouth daily.   Taking  . carbidopa-levodopa (SINEMET IR) 25-100 MG tablet 1/2 tablet 3 times daily for 3 weeks and then take 1 full tablet 3 times daily 90 tablet 3   . Cholecalciferol (VITAMIN D3 PO) Take 2,000 Units by mouth daily.   Taking  . Cyanocobalamin (B-12 PO) Take 1,000 mcg by mouth daily.   Taking  . diphenhydrAMINE HCl (BENADRYL PO) Take by mouth. Patient takes 2 by mouth Q HS to help with sleep.   Taking  . docusate sodium (COLACE) 100 MG capsule Take 100 mg by mouth. Four times a week   Taking  . GLUCOSAMINE HCL PO Take 1,500 mg by mouth daily.   Taking  . GLUCOSAMINE-FISH OIL-EPA-DHA PO Take 1 tablet by mouth daily. Fish oil 180mg , EPA 120mg , DHA   Taking  . meloxicam (MOBIC) 15  MG tablet Take 7.5 mg by mouth daily.   Taking  . Multiple Vitamins-Minerals (MULTIVITAMIN PO) Take 1 tablet by mouth daily.   Taking  . pramipexole (MIRAPEX) 0.5 MG tablet Take 1 tablet (0.5 mg total) by mouth 3 (three) times daily. 270 tablet 1 Taking  . Probiotic Product (PROBIOTIC-10 PO) Take by mouth daily.    Taking  . simvastatin (ZOCOR) 20 MG tablet Take 1 tablet by mouth every other day.  5 Taking  . UNABLE TO FIND Take 1 Dose by mouth daily. Med Name: pro advanced formula 220   Taking   No Known Allergies   Social History   Tobacco Use  . Smoking status: Never Smoker  . Smokeless tobacco: Never Used  Substance Use Topics  . Alcohol use: Yes    Frequency: Never    Comment: 1 cocktail before dinner     Family History  Problem Relation Age of Onset  . Stroke Mother   . Hypertension Father   . Heart disease Father   . Schizophrenia Sister   . Parkinsonism Brother      Review of Systems  Constitutional: Negative.   HENT: Negative.   Eyes: Negative.   Respiratory: Negative.   Cardiovascular: Negative.   Gastrointestinal: Positive for constipation.  Genitourinary: Negative.   Musculoskeletal: Positive for joint pain.  Skin: Negative.   Neurological: Positive for tremors.  Endo/Heme/Allergies: Negative.   Psychiatric/Behavioral: Negative.     Objective:  Physical Exam  Constitutional: She is oriented to person, place, and time. She appears well-developed.  HENT:  Head: Normocephalic.  Eyes: Pupils are equal, round, and reactive to light.  Neck: Neck supple. No JVD present. No tracheal deviation present. No thyromegaly present.  Cardiovascular: Normal rate, regular rhythm and intact distal pulses.  Respiratory: Effort normal and breath sounds normal. No respiratory distress. She has no wheezes.  GI: Soft. There is no abdominal tenderness. There is no guarding.  Musculoskeletal:     Right hip: She exhibits decreased range of motion, decreased strength, tenderness and bony tenderness. She exhibits no swelling, no deformity and no laceration.  Lymphadenopathy:    She has no cervical adenopathy.  Neurological: She is alert and oriented to person, place, and time. A sensory deficit (occsional faint numbness in the toes) is present.  Skin: Skin is warm and dry.  Psychiatric: She has a normal mood and affect.     Labs:  Estimated body mass index is 30 kg/m as calculated from the following:   Height as of 06/10/19: 5\' 4"  (1.626 m).   Weight as of 06/10/19: 79.3 kg.   Imaging Review Plain radiographs demonstrate severe degenerative joint disease of the right hip(s). The bone quality appears to be good for age and reported activity level.      Assessment/Plan:  End stage  arthritis, right hip(s)  The patient history, physical examination, clinical judgement of the provider and imaging studies are consistent with end stage degenerative joint disease of the right hip(s) and total hip arthroplasty is deemed medically necessary. The treatment options including medical management, injection therapy, arthroscopy and arthroplasty were discussed at length. The risks and benefits of total hip arthroplasty were presented and reviewed. The risks due to aseptic loosening, infection, stiffness, dislocation/subluxation,  thromboembolic complications and other imponderables were discussed.  The patient acknowledged the explanation, agreed to proceed with the plan and consent was signed. Patient is being admitted for inpatient treatment for surgery, pain control, PT, OT, prophylactic antibiotics, VTE prophylaxis, progressive ambulation and  ADL's and discharge planning.The patient is planning to be discharged home.     [place] Pugh Neville Walston   PA-C  09/27/2019, 12:18 PM

## 2019-10-06 NOTE — Patient Instructions (Addendum)
DUE TO COVID-19 ONLY ONE VISITOR IS ALLOWED TO COME WITH YOU AND STAY IN THE WAITING ROOM ONLY DURING PRE OP AND PROCEDURE DAY OF SURGERY. THE 1 VISITOR MAY VISIT WITH YOU AFTER SURGERY IN YOUR PRIVATE ROOM DURING VISITING HOURS ONLY!  YOU NEED TO HAVE A COVID 19 TEST ON_______ @_______ , THIS TEST MUST BE DONE BEFORE SURGERY, COME  [address], [place] , .  [facility]) ONCE YOUR COVID TEST IS COMPLETED, PLEASE BEGIN THE QUARANTINE INSTRUCTIONS AS OUTLINED IN YOUR HANDOUT.                Shelley Espinoza  10/06/2019   Your procedure is scheduled on: 10-12-19   Report to [facility] Main  Entrance   Report to admitting at            0900 AM     Call this number if you have problems the morning of surgery [phone]    Remember: NO SOLID FOOD AFTER MIDNIGHT THE NIGHT PRIOR TO SURGERY. NOTHING BY MOUTH EXCEPT CLEAR LIQUIDS UNTIL    0830 am . PLEASE FINISH ENSURE DRINK PER SURGEON ORDER  WHICH NEEDS TO BE COMPLETED AT       0830 am then nothing by mouth .    CLEAR LIQUID DIET   Foods Allowed                                                                                 Foods Excluded  Coffee and tea, regular and decaf   No creamer                           liquids that you cannot  Plain Jell-O any favor except red or purple                                           see through such as: Fruit ices (not with fruit pulp)                                                             milk, soups, orange juice  Iced Popsicles                                                               All solid food Carbonated beverages, regular and diet                                    Cranberry, grape and apple juices Sports drinks like Gatorade Lightly seasoned clear broth or consume(fat free)  Sugar, honey syrup  _____________________________________________________________________    BRUSH YOUR TEETH MORNING OF SURGERY AND RINSE YOUR MOUTH OUT, NO CHEWING  GUM CANDY OR MINTS.     Take these medicines the morning of surgery with A SIP OF WATER: mirapex                                 You may not have any metal on your body including hair pins and              piercings  Do not wear jewelry, make-up, lotions, powders or perfumes, deodorant             Do not wear nail polish on your fingernails.  Do not shave  48 hours prior to surgery.             Do not bring valuables to the hospital. [place].  Contacts, dentures or bridgework may not be worn into surgery.                Please read over the following fact sheets you were given: _____________________________________________________________________           [facility] - Preparing for Surgery Before surgery, you can play an important role.  Because skin is not sterile, your skin needs to be as free of germs as possible.  You can reduce the number of germs on your skin by washing with CHG (chlorahexidine gluconate) soap before surgery.  CHG is an antiseptic cleaner which kills germs and bonds with the skin to continue killing germs even after washing. Please DO NOT use if you have an allergy to CHG or antibacterial soaps.  If your skin becomes reddened/irritated stop using the CHG and inform your nurse when you arrive at Short Stay. Do not shave (including legs and underarms) for at least 48 hours prior to the first CHG shower.  You may shave your face/neck. Please follow these instructions carefully:  1.  Shower with CHG Soap the night before surgery and the  morning of Surgery.  2.  If you choose to wash your hair, wash your hair first as usual with your  normal  shampoo.  3.  After you shampoo, rinse your hair and body thoroughly to remove the  shampoo.                           4.  Use CHG as you would any other liquid soap.  You can apply chg directly  to the skin and wash                       Gently with a scrungie or clean  washcloth.  5.  Apply the CHG Soap to your body ONLY FROM THE NECK DOWN.   Do not use on face/ open                           Wound or open sores. Avoid contact with eyes, ears mouth and genitals (private parts).                       Wash face,  Genitals (private parts) with your normal soap.  6.  Wash thoroughly, paying special attention to the area where your surgery  will be performed.  7.  Thoroughly rinse your body with warm water from the neck down.  8.  DO NOT shower/wash with your normal soap after using and rinsing off  the CHG Soap.                9.  Pat yourself dry with a clean towel.            10.  Wear clean pajamas.            11.  Place clean sheets on your bed the night of your first shower and do not  sleep with pets. Day of Surgery : Do not apply any lotions/deodorants the morning of surgery.  Please wear clean clothes to the hospital/surgery center.  FAILURE TO FOLLOW THESE INSTRUCTIONS MAY RESULT IN THE CANCELLATION OF YOUR SURGERY PATIENT SIGNATURE_________________________________  NURSE SIGNATURE__________________________________  ________________________________________________________________________   Adam Phenix  An incentive spirometer is a tool that can help keep your lungs clear and active. This tool measures how well you are filling your lungs with each breath. Taking long deep breaths may help reverse or decrease the chance of developing breathing (pulmonary) problems (especially infection) following:  A long period of time when you are unable to move or be active. BEFORE THE PROCEDURE   If the spirometer includes an indicator to show your best effort, your nurse or respiratory therapist will set it to a desired goal.  If possible, sit up straight or lean slightly forward. Try not to slouch.  Hold the incentive spirometer in an upright position. INSTRUCTIONS FOR USE  1. Sit on the edge of your bed if possible, or sit up as far  as you can in bed or on a chair. 2. Hold the incentive spirometer in an upright position. 3. Breathe out normally. 4. Place the mouthpiece in your mouth and seal your lips tightly around it. 5. Breathe in slowly and as deeply as possible, raising the piston or the ball toward the top of the column. 6. Hold your breath for 3-5 seconds or for as long as possible. Allow the piston or ball to fall to the bottom of the column. 7. Remove the mouthpiece from your mouth and breathe out normally. 8. Rest for a few seconds and repeat Steps 1 through 7 at least 10 times every 1-2 hours when you are awake. Take your time and take a few normal breaths between deep breaths. 9. The spirometer may include an indicator to show your best effort. Use the indicator as a goal to work toward during each repetition. 10. After each set of 10 deep breaths, practice coughing to be sure your lungs are clear. If you have an incision (the cut made at the time of surgery), support your incision when coughing by placing a pillow or rolled up towels firmly against it. Once you are able to get out of bed, walk around indoors and cough well. You may stop using the incentive spirometer when instructed by your caregiver.  RISKS AND COMPLICATIONS  Take your time so you do not get dizzy or light-headed.  If you are in pain, you may need to take or ask for pain medication before doing incentive spirometry. It is harder to take a deep breath if you are having pain. AFTER USE  Rest and breathe slowly and easily.  It can be helpful to keep track of a log of your  progress. Your caregiver can provide you with a simple table to help with this. If you are using the spirometer at home, follow these instructions: [place] IF:   You are having difficultly using the spirometer.  You have trouble using the spirometer as often as instructed.  Your pain medication is not giving enough relief while using the spirometer.  You  develop fever of 100.5 F (38.1 C) or higher. SEEK IMMEDIATE MEDICAL CARE IF:   You cough up bloody sputum that had not been present before.  You develop fever of 102 F (38.9 C) or greater.  You develop worsening pain at or near the incision site. MAKE SURE YOU:   Understand these instructions.  Will watch your condition.  Will get help right away if you are not doing well or get worse. Document Released: 03/03/2007 Document Revised: 01/13/2012 Document Reviewed: 05/04/2007 Exit[employer] Patient Information 2014 Exit[employer], [place].   ________________________________________________________________________  WHAT IS A BLOOD TRANSFUSION? Blood Transfusion Information  A transfusion is the replacement of blood or some of its parts. Blood is made up of multiple cells which provide different functions.  Red blood cells carry oxygen and are used for blood loss replacement.  White blood cells fight against infection.  Platelets control bleeding.  Plasma helps clot blood.  Other blood products are available for specialized needs, such as hemophilia or other clotting disorders. BEFORE THE TRANSFUSION  Who gives blood for transfusions?   Healthy volunteers who are fully evaluated to make sure their blood is safe. This is blood bank blood. Transfusion therapy is the safest it has ever been in the practice of medicine. Before blood is taken from a donor, a complete history is taken to make sure that person has no history of diseases nor engages in risky social behavior (examples are intravenous drug use or sexual activity with multiple partners). The donor's travel history is screened to minimize risk of transmitting infections, such as malaria. The donated blood is tested for signs of infectious diseases, such as HIV and hepatitis. The blood is then tested to be sure it is compatible with you in order to minimize the chance of a transfusion reaction. If you or a relative donates blood, this is  often done in anticipation of surgery and is not appropriate for emergency situations. It takes many days to process the donated blood. RISKS AND COMPLICATIONS Although transfusion therapy is very safe and saves many lives, the main dangers of transfusion include:   Getting an infectious disease.  Developing a transfusion reaction. This is an allergic reaction to something in the blood you were given. Every precaution is taken to prevent this. The decision to have a blood transfusion has been considered carefully by your caregiver before blood is given. Blood is not given unless the benefits outweigh the risks. AFTER THE TRANSFUSION  Right after receiving a blood transfusion, you will usually feel much better and more energetic. This is especially true if your red blood cells have gotten low (anemic). The transfusion raises the level of the red blood cells which carry oxygen, and this usually causes an energy increase.  The nurse administering the transfusion will monitor you carefully for complications. HOME CARE INSTRUCTIONS  No special instructions are needed after a transfusion. You may find your energy is better. Speak with your caregiver about any limitations on activity for underlying diseases you may have. SEEK MEDICAL CARE IF:   Your condition is not improving after your transfusion.  You develop redness or  irritation at the intravenous (IV) site. SEEK IMMEDIATE MEDICAL CARE IF:  Any of the following symptoms occur over the next 12 hours:  Shaking chills.  You have a temperature by mouth above 102 F (38.9 C), not controlled by medicine.  Chest, back, or muscle pain.  People around you feel you are not acting correctly or are confused.  Shortness of breath or difficulty breathing.  Dizziness and fainting.  You get a rash or develop hives.  You have a decrease in urine output.  Your urine turns a dark color or changes to pink, red, or brown. Any of the following  symptoms occur over the next 10 days:  You have a temperature by mouth above 102 F (38.9 C), not controlled by medicine.  Shortness of breath.  Weakness after normal activity.  The white part of the eye turns yellow (jaundice).  You have a decrease in the amount of urine or are urinating less often.  Your urine turns a dark color or changes to pink, red, or brown. Document Released: 10/18/2000 Document Revised: 01/13/2012 Document Reviewed: 06/06/2008 [facility] Patient Information 2014 [place], [place].  _______________________________________________________________________

## 2019-10-08 ENCOUNTER — Encounter (HOSPITAL_COMMUNITY)
Admission: RE | Admit: 2019-10-08 | Discharge: 2019-10-08 | Disposition: A | Discharge status: Home / Self Care | Payer: Medicare Other | Source: Ambulatory Visit | Attending: Orthopedic Surgery | Admitting: Orthopedic Surgery

## 2019-10-08 ENCOUNTER — Encounter (HOSPITAL_COMMUNITY): Payer: Self-pay

## 2019-10-08 ENCOUNTER — Other Ambulatory Visit (HOSPITAL_COMMUNITY)
Admission: RE | Admit: 2019-10-08 | Discharge: 2019-10-08 | Disposition: A | Discharge status: Home / Self Care | Payer: Medicare Other | Source: Ambulatory Visit | Attending: Orthopedic Surgery | Admitting: Orthopedic Surgery

## 2019-10-08 ENCOUNTER — Other Ambulatory Visit: Payer: Self-pay

## 2019-10-08 DIAGNOSIS — Z01812 Encounter for preprocedural laboratory examination: Secondary | ICD-10-CM | POA: Diagnosis not present

## 2019-10-08 DIAGNOSIS — Z20828 Contact with and (suspected) exposure to other viral communicable diseases: Secondary | ICD-10-CM | POA: Diagnosis not present

## 2019-10-08 DIAGNOSIS — M1611 Unilateral primary osteoarthritis, right hip: Secondary | ICD-10-CM | POA: Diagnosis not present

## 2019-10-08 HISTORY — DX: Unspecified osteoarthritis, unspecified site: M19.90

## 2019-10-08 HISTORY — DX: Tremor, unspecified: R25.1

## 2019-10-08 HISTORY — DX: Overactive bladder: N32.81

## 2019-10-08 LAB — SURGICAL PCR SCREEN
MRSA, PCR: NEGATIVE
Staphylococcus aureus: NEGATIVE

## 2019-10-08 NOTE — Progress Notes (Signed)
PCP - [facility] Rad Cardiologist -   Chest x-ray -  EKG -  Stress Test -  ECHO -  Cardiac Cath -  Cbc/diff and CMP 09-28-19 on chart  Sleep Study -  CPAP -   Fasting Blood Sugar -  Checks Blood Sugar _____ times a day  Blood Thinner Instructions: Aspirin Instructions: Last Dose:  Anesthesia review:   Patient denies shortness of breath, fever, cough and chest pain at PAT appointment  NONE   Patient verbalized understanding of instructions that were given to them at the PAT appointment. Patient was also instructed that they will need to review over the PAT instructions again at home before surgery.

## 2019-10-09 LAB — ABO/RH: ABO/RH(D): O POS

## 2019-10-09 LAB — NOVEL CORONAVIRUS, NAA (HOSP ORDER, SEND-OUT TO REF LAB; TAT 18-24 HRS): SARS-CoV-2, NAA: NOT DETECTED

## 2019-10-12 ENCOUNTER — Observation Stay (HOSPITAL_COMMUNITY): Payer: Medicare Other

## 2019-10-12 ENCOUNTER — Other Ambulatory Visit: Payer: Self-pay

## 2019-10-12 ENCOUNTER — Encounter (HOSPITAL_COMMUNITY): Payer: Self-pay

## 2019-10-12 ENCOUNTER — Inpatient Hospital Stay (HOSPITAL_COMMUNITY): Payer: Medicare Other | Admitting: Anesthesiology

## 2019-10-12 ENCOUNTER — Inpatient Hospital Stay (HOSPITAL_COMMUNITY): Payer: Medicare Other | Admitting: Physician Assistant

## 2019-10-12 ENCOUNTER — Encounter (HOSPITAL_COMMUNITY)
Admission: RE | Disposition: A | Discharge status: Home / Self Care | Payer: Self-pay | Source: Home / Self Care | Attending: Orthopedic Surgery

## 2019-10-12 ENCOUNTER — Observation Stay (HOSPITAL_COMMUNITY)
Admission: RE | Admit: 2019-10-12 | Discharge: 2019-10-13 | Disposition: A | Discharge status: Home / Self Care | Payer: Medicare Other | Attending: Orthopedic Surgery | Admitting: Orthopedic Surgery

## 2019-10-12 DIAGNOSIS — M1611 Unilateral primary osteoarthritis, right hip: Principal | ICD-10-CM | POA: Insufficient documentation

## 2019-10-12 DIAGNOSIS — R262 Difficulty in walking, not elsewhere classified: Secondary | ICD-10-CM | POA: Insufficient documentation

## 2019-10-12 DIAGNOSIS — Z87891 Personal history of nicotine dependence: Secondary | ICD-10-CM | POA: Diagnosis not present

## 2019-10-12 DIAGNOSIS — E7800 Pure hypercholesterolemia, unspecified: Secondary | ICD-10-CM | POA: Diagnosis not present

## 2019-10-12 DIAGNOSIS — Z791 Long term (current) use of non-steroidal anti-inflammatories (NSAID): Secondary | ICD-10-CM | POA: Insufficient documentation

## 2019-10-12 DIAGNOSIS — E663 Overweight: Secondary | ICD-10-CM | POA: Diagnosis present

## 2019-10-12 DIAGNOSIS — Z96649 Presence of unspecified artificial hip joint: Secondary | ICD-10-CM

## 2019-10-12 DIAGNOSIS — Z96641 Presence of right artificial hip joint: Secondary | ICD-10-CM

## 2019-10-12 DIAGNOSIS — Z6830 Body mass index (BMI) 30.0-30.9, adult: Secondary | ICD-10-CM | POA: Diagnosis not present

## 2019-10-12 DIAGNOSIS — Z79899 Other long term (current) drug therapy: Secondary | ICD-10-CM | POA: Diagnosis not present

## 2019-10-12 DIAGNOSIS — G20 Parkinson's disease: Secondary | ICD-10-CM | POA: Insufficient documentation

## 2019-10-12 DIAGNOSIS — M6281 Muscle weakness (generalized): Secondary | ICD-10-CM | POA: Diagnosis not present

## 2019-10-12 DIAGNOSIS — Z419 Encounter for procedure for purposes other than remedying health state, unspecified: Secondary | ICD-10-CM

## 2019-10-12 HISTORY — PX: TOTAL HIP ARTHROPLASTY: SHX124

## 2019-10-12 LAB — TYPE AND SCREEN
ABO/RH(D): O POS
Antibody Screen: NEGATIVE

## 2019-10-12 SURGERY — ARTHROPLASTY, HIP, TOTAL, ANTERIOR APPROACH
Anesthesia: Spinal | Site: Hip | Laterality: Right

## 2019-10-12 MED ORDER — OXYCODONE HCL 5 MG PO TABS: 5.0000 mg | ORAL_TABLET | Freq: Once | ORAL | Status: DC | PRN

## 2019-10-12 MED ORDER — DIPHENHYDRAMINE HCL 12.5 MG/5ML PO ELIX
12.5000 mg | ORAL_SOLUTION | ORAL | Status: DC | PRN
  Administered 2019-10-13: 25 mg via ORAL
  Filled 2019-10-12: qty 10

## 2019-10-12 MED ORDER — ONDANSETRON HCL 4 MG PO TABS: 4.0000 mg | ORAL_TABLET | Freq: Four times a day (QID) | ORAL | Status: DC | PRN

## 2019-10-12 MED ORDER — OXYCODONE HCL 5 MG/5ML PO SOLN: 5.0000 mg | Freq: Once | ORAL | Status: DC | PRN

## 2019-10-12 MED ORDER — PROPOFOL 500 MG/50ML IV EMUL
INTRAVENOUS | Status: DC | PRN
  Administered 2019-10-12: 50 ug/kg/min via INTRAVENOUS

## 2019-10-12 MED ORDER — PROPOFOL 10 MG/ML IV BOLUS
INTRAVENOUS | Status: DC | PRN
  Administered 2019-10-12 (×4): 20 mg via INTRAVENOUS

## 2019-10-12 MED ORDER — MAGNESIUM CITRATE PO SOLN: 1.0000 | Freq: Once | ORAL | Status: DC | PRN

## 2019-10-12 MED ORDER — DEXAMETHASONE SODIUM PHOSPHATE 10 MG/ML IJ SOLN
10.0000 mg | Freq: Once | INTRAMUSCULAR | Status: AC
  Administered 2019-10-13: 10 mg via INTRAVENOUS
  Filled 2019-10-12: qty 1

## 2019-10-12 MED ORDER — PROPOFOL 500 MG/50ML IV EMUL
INTRAVENOUS | Status: AC
  Filled 2019-10-12: qty 50

## 2019-10-12 MED ORDER — ASPIRIN 81 MG PO CHEW
81.0000 mg | CHEWABLE_TABLET | Freq: Two times a day (BID) | ORAL | Status: DC
  Administered 2019-10-13: 81 mg via ORAL
  Filled 2019-10-12: qty 1

## 2019-10-12 MED ORDER — PRAMIPEXOLE DIHYDROCHLORIDE 0.25 MG PO TABS
0.5000 mg | ORAL_TABLET | Freq: Three times a day (TID) | ORAL | Status: DC
  Administered 2019-10-12 – 2019-10-13 (×4): 0.5 mg via ORAL
  Filled 2019-10-12 (×4): qty 2

## 2019-10-12 MED ORDER — METHOCARBAMOL 500 MG IVPB - SIMPLE MED
INTRAVENOUS | Status: AC
  Filled 2019-10-12: qty 50

## 2019-10-12 MED ORDER — SODIUM CHLORIDE 0.9 % IR SOLN
Status: DC | PRN
  Administered 2019-10-12: 1000 mL

## 2019-10-12 MED ORDER — ONDANSETRON HCL 4 MG/2ML IJ SOLN
INTRAMUSCULAR | Status: AC
  Filled 2019-10-12: qty 2

## 2019-10-12 MED ORDER — MELATONIN 1 MG PO TABS
1.0000 mg | ORAL_TABLET | Freq: Every evening | ORAL | Status: DC | PRN
  Administered 2019-10-13: 1 mg via ORAL
  Filled 2019-10-12 (×3): qty 1

## 2019-10-12 MED ORDER — CEFAZOLIN SODIUM-DEXTROSE 2-4 GM/100ML-% IV SOLN
2.0000 g | Freq: Four times a day (QID) | INTRAVENOUS | Status: AC
  Administered 2019-10-12 – 2019-10-13 (×2): 2 g via INTRAVENOUS
  Filled 2019-10-12 (×2): qty 100

## 2019-10-12 MED ORDER — BISACODYL 10 MG RE SUPP: 10.0000 mg | Freq: Every day | RECTAL | Status: DC | PRN

## 2019-10-12 MED ORDER — LACTATED RINGERS IV SOLN
INTRAVENOUS | Status: DC
  Administered 2019-10-12 (×2): via INTRAVENOUS

## 2019-10-12 MED ORDER — ONDANSETRON HCL 4 MG/2ML IJ SOLN
INTRAMUSCULAR | Status: DC | PRN
  Administered 2019-10-12: 4 mg via INTRAVENOUS

## 2019-10-12 MED ORDER — POLYETHYLENE GLYCOL 3350 17 G PO PACK
17.0000 g | PACK | Freq: Two times a day (BID) | ORAL | Status: DC
  Administered 2019-10-13: 17 g via ORAL
  Filled 2019-10-12: qty 1

## 2019-10-12 MED ORDER — BUPIVACAINE IN DEXTROSE 0.75-8.25 % IT SOLN
INTRATHECAL | Status: DC | PRN
  Administered 2019-10-12: 1.8 mL via INTRATHECAL

## 2019-10-12 MED ORDER — PHENYLEPHRINE 40 MCG/ML (10ML) SYRINGE FOR IV PUSH (FOR BLOOD PRESSURE SUPPORT)
PREFILLED_SYRINGE | INTRAVENOUS | Status: AC
  Filled 2019-10-12: qty 10

## 2019-10-12 MED ORDER — MENTHOL 3 MG MT LOZG: 1.0000 | LOZENGE | OROMUCOSAL | Status: DC | PRN

## 2019-10-12 MED ORDER — PHENYLEPHRINE HCL-NACL 10-0.9 MG/250ML-% IV SOLN
INTRAVENOUS | Status: DC | PRN
  Administered 2019-10-12: 40 ug/min via INTRAVENOUS

## 2019-10-12 MED ORDER — FENTANYL CITRATE (PF) 100 MCG/2ML IJ SOLN
INTRAMUSCULAR | Status: DC | PRN
  Administered 2019-10-12: 100 ug via INTRAVENOUS

## 2019-10-12 MED ORDER — HYDROMORPHONE HCL 1 MG/ML IJ SOLN
0.2500 mg | INTRAMUSCULAR | Status: DC | PRN
  Administered 2019-10-12 (×2): 0.5 mg via INTRAVENOUS

## 2019-10-12 MED ORDER — ALUM & MAG HYDROXIDE-SIMETH 200-200-20 MG/5ML PO SUSP: 15.0000 mL | ORAL | Status: DC | PRN

## 2019-10-12 MED ORDER — HYDROCODONE-ACETAMINOPHEN 7.5-325 MG PO TABS: 1.0000 | ORAL_TABLET | ORAL | Status: DC | PRN

## 2019-10-12 MED ORDER — CHLORHEXIDINE GLUCONATE 4 % EX LIQD: 60.0000 mL | Freq: Once | CUTANEOUS | Status: DC

## 2019-10-12 MED ORDER — TRANEXAMIC ACID-NACL 1000-0.7 MG/100ML-% IV SOLN
1000.0000 mg | Freq: Once | INTRAVENOUS | Status: AC
  Administered 2019-10-12: 17:00:00 1000 mg via INTRAVENOUS
  Filled 2019-10-12: qty 100

## 2019-10-12 MED ORDER — DOCUSATE SODIUM 100 MG PO CAPS
100.0000 mg | ORAL_CAPSULE | Freq: Two times a day (BID) | ORAL | Status: DC
  Administered 2019-10-12 – 2019-10-13 (×2): 100 mg via ORAL
  Filled 2019-10-12 (×2): qty 1

## 2019-10-12 MED ORDER — FENTANYL CITRATE (PF) 100 MCG/2ML IJ SOLN
INTRAMUSCULAR | Status: AC
  Filled 2019-10-12: qty 2

## 2019-10-12 MED ORDER — METOCLOPRAMIDE HCL 5 MG/ML IJ SOLN: 5.0000 mg | Freq: Three times a day (TID) | INTRAMUSCULAR | Status: DC | PRN

## 2019-10-12 MED ORDER — FERROUS SULFATE 325 (65 FE) MG PO TABS
325.0000 mg | ORAL_TABLET | Freq: Three times a day (TID) | ORAL | Status: DC
  Administered 2019-10-13 (×2): 325 mg via ORAL
  Filled 2019-10-12 (×2): qty 1

## 2019-10-12 MED ORDER — METHOCARBAMOL 500 MG IVPB - SIMPLE MED
500.0000 mg | Freq: Four times a day (QID) | INTRAVENOUS | Status: DC | PRN
  Administered 2019-10-12: 500 mg via INTRAVENOUS
  Filled 2019-10-12: qty 50

## 2019-10-12 MED ORDER — SIMVASTATIN 20 MG PO TABS
20.0000 mg | ORAL_TABLET | ORAL | Status: DC
  Filled 2019-10-12: qty 1

## 2019-10-12 MED ORDER — DEXAMETHASONE SODIUM PHOSPHATE 10 MG/ML IJ SOLN
INTRAMUSCULAR | Status: AC
  Filled 2019-10-12: qty 1

## 2019-10-12 MED ORDER — MORPHINE SULFATE (PF) 4 MG/ML IV SOLN: 0.5000 mg | INTRAVENOUS | Status: DC | PRN

## 2019-10-12 MED ORDER — ONDANSETRON HCL 4 MG/2ML IJ SOLN: 4.0000 mg | Freq: Four times a day (QID) | INTRAMUSCULAR | Status: DC | PRN

## 2019-10-12 MED ORDER — DEXAMETHASONE SODIUM PHOSPHATE 10 MG/ML IJ SOLN
INTRAMUSCULAR | Status: DC | PRN
  Administered 2019-10-12: 8 mg via INTRAVENOUS

## 2019-10-12 MED ORDER — PROMETHAZINE HCL 25 MG/ML IJ SOLN: 6.2500 mg | INTRAMUSCULAR | Status: DC | PRN

## 2019-10-12 MED ORDER — ACETAMINOPHEN 325 MG PO TABS
325.0000 mg | ORAL_TABLET | Freq: Four times a day (QID) | ORAL | Status: DC | PRN
  Administered 2019-10-13: 650 mg via ORAL
  Filled 2019-10-12 (×2): qty 2

## 2019-10-12 MED ORDER — PHENYLEPHRINE 40 MCG/ML (10ML) SYRINGE FOR IV PUSH (FOR BLOOD PRESSURE SUPPORT)
PREFILLED_SYRINGE | INTRAVENOUS | Status: DC | PRN
  Administered 2019-10-12 (×2): 80 ug via INTRAVENOUS

## 2019-10-12 MED ORDER — METOCLOPRAMIDE HCL 5 MG PO TABS: 5.0000 mg | ORAL_TABLET | Freq: Three times a day (TID) | ORAL | Status: DC | PRN

## 2019-10-12 MED ORDER — HYDROMORPHONE HCL 1 MG/ML IJ SOLN
INTRAMUSCULAR | Status: AC
  Filled 2019-10-12: qty 2

## 2019-10-12 MED ORDER — SODIUM CHLORIDE 0.9 % IV BOLUS
500.0000 mL | Freq: Once | INTRAVENOUS | Status: AC
  Administered 2019-10-12: 500 mL via INTRAVENOUS

## 2019-10-12 MED ORDER — PHENOL 1.4 % MT LIQD: 1.0000 | OROMUCOSAL | Status: DC | PRN

## 2019-10-12 MED ORDER — PHENYLEPHRINE HCL (PRESSORS) 10 MG/ML IV SOLN
INTRAVENOUS | Status: AC
  Filled 2019-10-12: qty 1

## 2019-10-12 MED ORDER — SODIUM CHLORIDE 0.9 % IV SOLN
INTRAVENOUS | Status: DC
  Administered 2019-10-12 – 2019-10-13 (×2): via INTRAVENOUS

## 2019-10-12 MED ORDER — CEFAZOLIN SODIUM-DEXTROSE 2-4 GM/100ML-% IV SOLN
2.0000 g | INTRAVENOUS | Status: AC
  Administered 2019-10-12: 2 g via INTRAVENOUS
  Filled 2019-10-12: qty 100

## 2019-10-12 MED ORDER — METHOCARBAMOL 500 MG PO TABS
500.0000 mg | ORAL_TABLET | Freq: Four times a day (QID) | ORAL | Status: DC | PRN
  Administered 2019-10-12 – 2019-10-13 (×3): 500 mg via ORAL
  Filled 2019-10-12 (×3): qty 1

## 2019-10-12 MED ORDER — HYDROCODONE-ACETAMINOPHEN 5-325 MG PO TABS
1.0000 | ORAL_TABLET | ORAL | Status: DC | PRN
  Administered 2019-10-12 – 2019-10-13 (×3): 1 via ORAL
  Filled 2019-10-12: qty 1
  Filled 2019-10-12: qty 2
  Filled 2019-10-12: qty 1

## 2019-10-12 MED ORDER — TRANEXAMIC ACID-NACL 1000-0.7 MG/100ML-% IV SOLN
1000.0000 mg | INTRAVENOUS | Status: AC
  Administered 2019-10-12: 1000 mg via INTRAVENOUS
  Filled 2019-10-12: qty 100

## 2019-10-12 MED ORDER — DEXAMETHASONE SODIUM PHOSPHATE 10 MG/ML IJ SOLN: 10.0000 mg | Freq: Once | INTRAMUSCULAR | Status: DC

## 2019-10-12 SURGICAL SUPPLY — 46 items
ARTICULEZE HEAD (Hips) ×3 IMPLANT
BAG DECANTER FOR FLEXI CONT (MISCELLANEOUS) IMPLANT
BAG ZIPLOCK 12X15 (MISCELLANEOUS) IMPLANT
BLADE SAG 18X100X1.27 (BLADE) ×3 IMPLANT
BLADE SURG SZ10 CARB STEEL (BLADE) IMPLANT
COVER PERINEAL POST (MISCELLANEOUS) ×3 IMPLANT
COVER SURGICAL LIGHT HANDLE (MISCELLANEOUS) ×3 IMPLANT
COVER WAND RF STERILE (DRAPES) IMPLANT
CUP ACETBLR 52 OD PINNACLE (Hips) ×3 IMPLANT
DERMABOND ADVANCED (GAUZE/BANDAGES/DRESSINGS) ×2
DERMABOND ADVANCED .7 DNX12 (GAUZE/BANDAGES/DRESSINGS) ×1 IMPLANT
DRAPE STERI IOBAN 125X83 (DRAPES) ×3 IMPLANT
DRAPE U-SHAPE 47X51 STRL (DRAPES) ×9 IMPLANT
DRESSING AQUACEL AG SP 3.5X10 (GAUZE/BANDAGES/DRESSINGS) ×1 IMPLANT
DRSG AQUACEL AG ADV 3.5X10 (GAUZE/BANDAGES/DRESSINGS) ×3 IMPLANT
DRSG AQUACEL AG SP 3.5X10 (GAUZE/BANDAGES/DRESSINGS) ×3
DURAPREP 26ML APPLICATOR (WOUND CARE) ×3 IMPLANT
ELECT BLADE TIP CTD 4 INCH (ELECTRODE) ×3 IMPLANT
ELECT REM PT RETURN 15FT ADLT (MISCELLANEOUS) ×3 IMPLANT
GLOVE BIOGEL PI IND STRL 6.5 (GLOVE) IMPLANT
GLOVE BIOGEL PI IND STRL 7.5 (GLOVE) ×1 IMPLANT
GLOVE BIOGEL PI IND STRL 8.5 (GLOVE) ×1 IMPLANT
GLOVE BIOGEL PI INDICATOR 6.5 (GLOVE)
GLOVE BIOGEL PI INDICATOR 7.5 (GLOVE) ×2
GLOVE BIOGEL PI INDICATOR 8.5 (GLOVE) ×2
GLOVE ECLIPSE 8.0 STRL XLNG CF (GLOVE) ×6 IMPLANT
GLOVE ORTHO TXT STRL SZ7.5 (GLOVE) ×3 IMPLANT
GOWN STRL REUS W/TWL LRG LVL3 (GOWN DISPOSABLE) ×3 IMPLANT
GOWN STRL REUS W/TWL XL LVL3 (GOWN DISPOSABLE) ×3 IMPLANT
HEAD ARTICULEZE (Hips) ×1 IMPLANT
HOLDER FOLEY CATH W/STRAP (MISCELLANEOUS) ×3 IMPLANT
KIT TURNOVER KIT A (KITS) ×3 IMPLANT
LINER NEUTRAL 52X36MM PLUS 4 (Liner) ×3 IMPLANT
PACK ANTERIOR HIP CUSTOM (KITS) ×3 IMPLANT
PENCIL SMOKE EVACUATOR (MISCELLANEOUS) ×3 IMPLANT
SCREW 6.5MMX30MM (Screw) ×3 IMPLANT
STEM FEMORAL SZ 6MM STD ACTIS (Stem) ×3 IMPLANT
SUT MNCRL AB 4-0 PS2 18 (SUTURE) ×3 IMPLANT
SUT STRATAFIX 0 PDS 27 VIOLET (SUTURE) ×3
SUT VIC AB 1 CT1 36 (SUTURE) ×9 IMPLANT
SUT VIC AB 2-0 CT1 27 (SUTURE) ×4
SUT VIC AB 2-0 CT1 TAPERPNT 27 (SUTURE) ×2 IMPLANT
SUTURE STRATFX 0 PDS 27 VIOLET (SUTURE) ×1 IMPLANT
TRAY FOLEY MTR SLVR 14FR STAT (SET/KITS/TRAYS/PACK) ×3 IMPLANT
WATER STERILE IRR 1000ML POUR (IV SOLUTION) ×6 IMPLANT
YANKAUER SUCT BULB TIP 10FT TU (MISCELLANEOUS) ×3 IMPLANT

## 2019-10-12 NOTE — Anesthesia Procedure Notes (Signed)
Spinal  Patient location during procedure: OR Start time: 10/12/2019 11:32 AM End time: 10/12/2019 11:37 AM Staffing Resident/CRNA: Sharlette Dense, CRNA Performed: resident/CRNA  Preanesthetic Checklist Completed: patient identified, site marked, surgical consent, pre-op evaluation, timeout performed, IV checked, risks and benefits discussed and monitors and equipment checked Spinal Block Patient position: sitting Prep: site prepped and draped and DuraPrep Patient monitoring: heart rate, continuous pulse ox and blood pressure Approach: midline Location: L3-4 Injection technique: single-shot Needle Needle type: Pencan  Needle gauge: 24 G Needle length: 9 cm Additional Notes Kit expiration date 10/03/2020 and lot # [id number] Clear free flow CSF, negative heme, negative paresthesia Tolerated well and returned to supine position

## 2019-10-12 NOTE — Evaluation (Signed)
Physical Therapy Evaluation Patient Details Name: Shelley Espinoza MRN: [id number] DOB: [birth year] Today's Date: 10/12/2019   History of Present Illness  R DA-THA; h/o Parkinsons  Clinical Impression  Pt is s/p THA resulting in the deficits listed below (see PT Problem List). Pt ambulated 56' with RW, no loss of balance. Initiated THA HEP, good progress expected.  Pt will benefit from skilled PT to increase their independence and safety with mobility to allow discharge to the venue listed below.      Follow Up Recommendations Follow surgeon's recommendation for DC plan and follow-up therapies    Equipment Recommendations  None recommended by PT    Recommendations for Other Services       Precautions / Restrictions Precautions Precautions: Fall Restrictions Weight Bearing Restrictions: No Other Position/Activity Restrictions: WBAT      Mobility  Bed Mobility Overal bed mobility: Needs Assistance Bed Mobility: Supine to Sit     Supine to sit: Min assist     General bed mobility comments: min A for RLE  Transfers Overall transfer level: Needs assistance Equipment used: Rolling walker (2 wheeled) Transfers: Sit to/from Stand Sit to Stand: From elevated surface;Min assist         General transfer comment: VCs hand placement, min A to rise  Ambulation/Gait Ambulation/Gait assistance: Min guard Gait Distance (Feet): 60 Feet Assistive device: Rolling walker (2 wheeled) Gait Pattern/deviations: Step-to pattern;Decreased step length - left;Decreased step length - right Gait velocity: decr   General Gait Details: VCs sequencing, no loss of balance  Stairs            Wheelchair Mobility    Modified Rankin (Stroke Patients Only)       Balance Overall balance assessment: Modified Independent                                           Pertinent Vitals/Pain Pain Assessment: (P) 0-10 Pain Score: 4  Pain Location: R hip Pain Descriptors /  Indicators: Aching Pain Intervention(s): Limited activity within patient's tolerance;Monitored during session;Premedicated before session;Ice applied    Home Living Family/patient expects to be discharged to:: Private residence Living Arrangements: Spouse/significant other Available Help at Discharge: Family   Home Access: Stairs to enter   Secretary/administrator of Steps: 1 1/2 Home Layout: Laundry or work area in basement;Able to live on main level with bedroom/bathroom Home Equipment: Environmental consultant - 2 wheels;Bedside commode      Prior Function Level of Independence: Independent               Hand Dominance        Extremity/Trunk Assessment   Upper Extremity Assessment Upper Extremity Assessment: RUE deficits/detail RUE Deficits / Details: resting tremor, this is baseline per pt    Lower Extremity Assessment Lower Extremity Assessment: RLE deficits/detail RLE Deficits / Details: hip -3/5, knee ext +3/5 RLE Sensation: WNL RLE Coordination: WNL    Cervical / Trunk Assessment Cervical / Trunk Assessment: Normal  Communication   Communication: No difficulties  Cognition Arousal/Alertness: Awake/alert Behavior During Therapy: WFL for tasks assessed/performed Overall Cognitive Status: Within Functional Limits for tasks assessed                                        General Comments      Exercises Total  Joint Exercises Ankle Circles/Pumps: AROM;Both;10 reps;Supine Heel Slides: AAROM;Right;10 reps;Supine Long Arc Quad: AROM;Right;5 reps;Seated   Assessment/Plan    PT Assessment Patient needs continued PT services  PT Problem List Decreased strength;Decreased mobility;Decreased activity tolerance;Pain;Decreased range of motion       PT Treatment Interventions DME instruction;Gait training;Functional mobility training;Therapeutic activities;Therapeutic exercise;Patient/family education    PT Goals (Current goals can be found in the Care Plan  section)  Acute Rehab PT Goals Patient Stated Goal: to be able to go for walks, ride stationary bike PT Goal Formulation: With patient Time For Goal Achievement: 10/19/19 Potential to Achieve Goals: Good    Frequency 7X/week   Barriers to discharge        Co-evaluation               AM-PAC PT "6 Clicks" Mobility  Outcome Measure Help needed turning from your back to your side while in a flat bed without using bedrails?: A Little Help needed moving from lying on your back to sitting on the side of a flat bed without using bedrails?: A Little Help needed moving to and from a bed to a chair (including a wheelchair)?: A Little Help needed standing up from a chair using your arms (e.g., wheelchair or bedside chair)?: A Little Help needed to walk in hospital room?: A Little Help needed climbing 3-5 steps with a railing? : A Lot 6 Click Score: 17    End of Session Equipment Utilized During Treatment: Gait belt Activity Tolerance: Patient tolerated treatment well Patient left: in chair;with call bell/phone within reach Nurse Communication: Mobility status PT Visit Diagnosis: Muscle weakness (generalized) (M62.81);Difficulty in walking, not elsewhere classified (R26.2);Pain Pain - Right/Left: Right Pain - part of body: Hip    Time: 8295-6213 PT Time Calculation (min) (ACUTE ONLY): 27 min   Charges:   PT Evaluation $PT Eval Low Complexity: 1 Low PT Treatments $Gait Training: 8-22 mins        Blondell Reveal Kistler PT 10/12/2019  Acute Rehabilitation Services Pager [phone] Office [phone]

## 2019-10-12 NOTE — Anesthesia Procedure Notes (Signed)
Date/Time: 10/12/2019 11:30 AM Performed by: Sharlette Dense, CRNA Oxygen Delivery Method: Simple face mask

## 2019-10-12 NOTE — Op Note (Signed)
NAME:  Genny Caulder                ACCOUNT NO.: [id number]      MEDICAL RECORD NO.: [id number]      FACILITY:  [facility]      PHYSICIAN:  Shelda Pal  DATE OF BIRTH:  [birth year]     DATE OF PROCEDURE:  10/12/2019                                 OPERATIVE REPORT         PREOPERATIVE DIAGNOSIS: Right  hip osteoarthritis.      POSTOPERATIVE DIAGNOSIS:  Right hip osteoarthritis.      PROCEDURE:  Right total hip replacement through an anterior approach   utilizing DePuy THR system, component size 38mm pinnacle cup, a size 36+4 neutral   Altrex liner, a size 6 standard Actis stem with a 36+5 Articuleze metal head ball.      SURGEON:  Madlyn Frankel. Charlann Boxer, M.D.      ASSISTANT:  Lanney Gins, PA-C     ANESTHESIA:  Spinal.      SPECIMENS:  None.      COMPLICATIONS:  None.      BLOOD LOSS:  250 cc     DRAINS:  None.      INDICATION OF THE PROCEDURE:  Deshana Rominger is a 78 y.o. female who had   presented to office for evaluation of right hip pain.  Radiographs revealed   progressive degenerative changes with bone-on-bone   articulation of the  hip joint, including subchondral cystic changes and osteophytes.  The patient had painful limited range of   motion significantly affecting their overall quality of life and function.  The patient was failing to    respond to conservative measures including medications and/or injections and activity modification and at this point was ready   to proceed with more definitive measures.  Consent was obtained for   benefit of pain relief.  Specific risks of infection, DVT, component   failure, dislocation, neurovascular injury, and need for revision surgery were reviewed in the office as well discussion of   the anterior versus posterior approach were reviewed.     PROCEDURE IN DETAIL:  The patient was brought to operative theater.   Once adequate anesthesia, preoperative antibiotics, 2 gm of Ancef, 1 gm of Tranexamic Acid,  and 10 mg of Decadron were administered, the patient was positioned supine on the [employer] table.  Once the patient was safely positioned with adequate padding of boney prominences we predraped out the hip, and used fluoroscopy to confirm orientation of the pelvis.      The right hip was then prepped and draped from proximal iliac crest to   mid thigh with a shower curtain technique.      Time-out was performed identifying the patient, planned procedure, and the appropriate extremity.     An incision was then made 2 cm lateral to the   anterior superior iliac spine extending over the orientation of the   tensor fascia lata muscle and sharp dissection was carried down to the   fascia of the muscle.      The fascia was then incised.  The muscle belly was identified and swept   laterally and retractor placed along the superior neck.  Following   cauterization of the circumflex vessels and removing some pericapsular   fat,  a second cobra retractor was placed on the inferior neck.  A T-capsulotomy was made along the line of the   superior neck to the trochanteric fossa, then extended proximally and   distally.  Tag sutures were placed and the retractors were then placed   intracapsular.  We then identified the trochanteric fossa and   orientation of my neck cut and then made a neck osteotomy with the femur on traction.  The femoral   head was removed without difficulty or complication.  Traction was let   off and retractors were placed posterior and anterior around the   acetabulum.      The labrum and foveal tissue were debrided.  I began reaming with a 45 mm   reamer and reamed up to 51 mm reamer with good bony bed preparation and a 52 mm  cup was chosen.  The final 52 mm Pinnacle cup was then impacted under fluoroscopy to confirm the depth of penetration and orientation with respect to   Abduction and forward flexion.  A screw was placed into the ilium followed by the hole eliminator.  The  final   36+4 neutral Altrex liner was impacted with good visualized rim fit.  The cup was positioned anatomically within the acetabular portion of the pelvis.      At this point, the femur was rolled to 100 degrees.  Further capsule was   released off the inferior aspect of the femoral neck.  I then   released the superior capsule proximally.  With the leg in a neutral position the hook was placed laterally   along the femur under the vastus lateralis origin and elevated manually and then held in position using the hook attachment on the bed.  The leg was then extended and adducted with the leg rolled to 100   degrees of external rotation.  Retractors were placed along the medial calcar and posteriorly over the greater trochanter.  Once the proximal femur was fully   exposed, I used a box osteotome to set orientation.  I then began   broaching with the starting chili pepper broach and passed this by hand and then broached up to 6.  With the 6 broach in place I chose a standard neck and did several trial reductions.  The offset was appropriate, leg lengths   appeared to be equal best matched with the +5 head ball trial confirmed radiographically.   Given these findings, I went ahead and dislocated the hip, repositioned all   retractors and positioned the right hip in the extended and abducted position.  The final 6 standard Actis stem was   chosen and it was impacted down to the level of neck cut.  Based on this   and the trial reductions, a final 36+5 Articuleze metal head ball was chosen and   impacted onto a clean and dry trunnion, and the hip was reduced.  The   hip had been irrigated throughout the case again at this point.  I did   reapproximate the superior capsular leaflet to the anterior leaflet   using #1 Vicryl.  The fascia of the   tensor fascia lata muscle was then reapproximated using #1 Vicryl and #0 Stratafix sutures.  The   remaining wound was closed with 2-0 Vicryl and running  4-0 Monocryl.   The hip was cleaned, dried, and dressed sterilely using Dermabond and   Aquacel dressing.  The patient was then brought   to recovery room in stable  condition tolerating the procedure well.    Lanney GinsMatthew Babish, PA-C was present for the entirety of the case involved from   preoperative positioning, perioperative retractor management, general   facilitation of the case, as well as primary wound closure as assistant.            Madlyn FrankelMatthew D. Charlann Boxerlin, M.D.        10/12/2019 1:03 PM

## 2019-10-12 NOTE — Interval H&P Note (Signed)
History and Physical Interval Note:  10/12/2019 10:30 AM  Shelley Espinoza  has presented today for surgery, with the diagnosis of Right hip osteoarthritis.  The various methods of treatment have been discussed with the patient and family. After consideration of risks, benefits and other options for treatment, the patient has consented to  Procedure(s) with comments: TOTAL HIP ARTHROPLASTY ANTERIOR APPROACH (Right) - 70 mins as a surgical intervention.  The patient's history has been reviewed, patient examined, no change in status, stable for surgery.  I have reviewed the patient's chart and labs.  Questions were answered to the patient's satisfaction.     Mauri Pole

## 2019-10-12 NOTE — Anesthesia Postprocedure Evaluation (Signed)
Anesthesia Post Note  Patient: Shelley Espinoza  Procedure(s) Performed: TOTAL HIP ARTHROPLASTY ANTERIOR APPROACH (Right Hip)     Patient location during evaluation: PACU Anesthesia Type: Spinal Level of consciousness: awake and alert Pain management: pain level controlled Vital Signs Assessment: post-procedure vital signs reviewed and stable Respiratory status: spontaneous breathing, nonlabored ventilation and respiratory function stable Cardiovascular status: blood pressure returned to baseline and stable Postop Assessment: no apparent nausea or vomiting Anesthetic complications: no    Last Vitals:  Vitals:   10/12/19 1415 10/12/19 1500  BP: 104/68 96/71  Pulse: 75 74  Resp: 17 18  Temp: 36.4 C 36.9 C  SpO2: 97% 100%    Last Pain:  Vitals:   10/12/19 1415  TempSrc:   PainSc: 2                  Lynda Rainwater

## 2019-10-12 NOTE — Transfer of Care (Signed)
Immediate Anesthesia Transfer of Care Note  Patient: Shelley Espinoza  Procedure(s) Performed: TOTAL HIP ARTHROPLASTY ANTERIOR APPROACH (Right Hip)  Patient Location: PACU  Anesthesia Type:Spinal  Level of Consciousness: awake, alert  and oriented  Airway & Oxygen Therapy: Patient Spontanous Breathing and Patient connected to face mask oxygen  Post-op Assessment: Report given to RN and Post -op Vital signs reviewed and stable  Post vital signs: Reviewed and stable  Last Vitals:  Vitals Value Taken Time  BP 108/69 10/12/19 1330  Temp    Pulse 78 10/12/19 1333  Resp 20 10/12/19 1333  SpO2 97 % 10/12/19 1333  Vitals shown include unvalidated device data.  Last Pain:  Vitals:   10/12/19 0959  TempSrc: Oral  PainSc:          Complications: No apparent anesthesia complications

## 2019-10-12 NOTE — Anesthesia Preprocedure Evaluation (Signed)
Anesthesia Evaluation  Patient identified by MRN, date of birth, ID band Patient awake    Reviewed: Allergy & Precautions, NPO status , Patient's Chart, lab work & pertinent test results  Airway Mallampati: II  TM Distance: >3 FB Neck ROM: Full    Dental no notable dental hx.    Pulmonary neg pulmonary ROS, former smoker,    Pulmonary exam normal breath sounds clear to auscultation       Cardiovascular negative cardio ROS Normal cardiovascular exam Rhythm:Regular Rate:Normal     Neuro/Psych negative neurological ROS  negative psych ROS   GI/Hepatic negative GI ROS, Neg liver ROS,   Endo/Other  negative endocrine ROS  Renal/GU negative Renal ROS  negative genitourinary   Musculoskeletal  (+) Arthritis , Osteoarthritis,    Abdominal   Peds negative pediatric ROS (+)  Hematology negative hematology ROS (+)   Anesthesia Other Findings   Reproductive/Obstetrics negative OB ROS                             Anesthesia Physical Anesthesia Plan  ASA: II  Anesthesia Plan: Spinal   Post-op Pain Management:    Induction: Intravenous  PONV Risk Score and Plan: 2 and Ondansetron, Midazolam and Treatment may vary due to age or medical condition  Airway Management Planned: Simple Face Mask  Additional Equipment:   Intra-op Plan:   Post-operative Plan:   Informed Consent: I have reviewed the patients History and Physical, chart, labs and discussed the procedure including the risks, benefits and alternatives for the proposed anesthesia with the patient or authorized representative who has indicated his/her understanding and acceptance.     Dental advisory given  Plan Discussed with: CRNA  Anesthesia Plan Comments:         Anesthesia Quick Evaluation

## 2019-10-12 NOTE — Discharge Instructions (Signed)

## 2019-10-12 NOTE — Progress Notes (Signed)
PT Cancellation Note  Patient Details Name: Shelley Espinoza MRN: [id number] DOB: 10/13/1941   Cancelled Treatment:    Reason Eval/Treat Not Completed: Medical issues which prohibited therapy(pt's spinal has not yet worn off. Will follow.)  Philomena Doheny PT 10/12/2019  Acute Rehabilitation Services Pager [phone] Office [phone]

## 2019-10-12 NOTE — Plan of Care (Signed)
  Problem: Education: Goal: Knowledge of General Education information will improve Description: Including pain rating scale, medication(s)/side effects and non-pharmacologic comfort measures Outcome: Progressing   Problem: Health Behavior/Discharge Planning: Goal: Ability to manage health-related needs will improve Outcome: Progressing   Problem: Clinical Measurements: Goal: Ability to maintain clinical measurements within normal limits will improve Outcome: Progressing Goal: Will remain free from infection Outcome: Progressing Goal: Diagnostic test results will improve Outcome: Progressing Goal: Respiratory complications will improve Outcome: Progressing Goal: Cardiovascular complication will be avoided Outcome: Progressing   Problem: Activity: Goal: Risk for activity intolerance will decrease Outcome: Progressing   Problem: Nutrition: Goal: Adequate nutrition will be maintained Outcome: Progressing   Problem: Coping: Goal: Level of anxiety will decrease Outcome: Progressing   Problem: Elimination: Goal: Will not experience complications related to bowel motility Outcome: Progressing Goal: Will not experience complications related to urinary retention Outcome: Progressing   Problem: Pain Managment: Goal: General experience of comfort will improve Outcome: Progressing   Problem: Skin Integrity: Goal: Risk for impaired skin integrity will decrease Outcome: Progressing   Problem: Education: Goal: Knowledge of the prescribed therapeutic regimen will improve Outcome: Progressing Goal: Understanding of discharge needs will improve Outcome: Progressing Goal: Individualized Educational Video(s) Outcome: Progressing   Problem: Activity: Goal: Ability to avoid complications of mobility impairment will improve Outcome: Progressing Goal: Ability to tolerate increased activity will improve Outcome: Progressing   Problem: Clinical Measurements: Goal: Postoperative  complications will be avoided or minimized Outcome: Progressing   Problem: Pain Management: Goal: Pain level will decrease with appropriate interventions Outcome: Progressing   Problem: Skin Integrity: Goal: Will show signs of wound healing Outcome: Progressing

## 2019-10-13 DIAGNOSIS — E663 Overweight: Secondary | ICD-10-CM | POA: Diagnosis present

## 2019-10-13 DIAGNOSIS — M1611 Unilateral primary osteoarthritis, right hip: Secondary | ICD-10-CM | POA: Diagnosis not present

## 2019-10-13 LAB — BASIC METABOLIC PANEL
Anion gap: 7 (ref 5–15)
BUN: 12 mg/dL (ref 8–23)
CO2: 24 mmol/L (ref 22–32)
Calcium: 8.8 mg/dL — ABNORMAL LOW (ref 8.9–10.3)
Chloride: 107 mmol/L (ref 98–111)
Creatinine, Ser: 0.65 mg/dL (ref 0.44–1.00)
GFR calc Af Amer: >60 mL/min (ref >60)
GFR calc non Af Amer: >60 mL/min (ref >60)
Glucose, Bld: 147 mg/dL — ABNORMAL HIGH (ref 70–99)
Potassium: 4.4 mmol/L (ref 3.5–5.1)
Sodium: 138 mmol/L (ref 135–145)

## 2019-10-13 LAB — CBC
HCT: 35.2 % — ABNORMAL LOW (ref 36.0–46.0)
Hemoglobin: 11.5 g/dL — ABNORMAL LOW (ref 12.0–15.0)
MCH: 33.6 pg (ref 26.0–34.0)
MCHC: 32.7 g/dL (ref 30.0–36.0)
MCV: 102.9 fL — ABNORMAL HIGH (ref 80.0–100.0)
Platelets: 174 10*3/uL (ref 150–400)
RBC: 3.42 MIL/uL — ABNORMAL LOW (ref 3.87–5.11)
RDW: 13.2 % (ref 11.5–15.5)
WBC: 8 10*3/uL (ref 4.0–10.5)
nRBC: 0 % (ref 0.0–0.2)

## 2019-10-13 MED ORDER — ASPIRIN 81 MG PO CHEW: 81.0000 mg | CHEWABLE_TABLET | Freq: Two times a day (BID) | ORAL | 0 refills | Status: AC

## 2019-10-13 MED ORDER — FERROUS SULFATE 325 (65 FE) MG PO TABS: 325.0000 mg | ORAL_TABLET | Freq: Three times a day (TID) | ORAL | 0 refills | Status: DC

## 2019-10-13 MED ORDER — HYDROCODONE-ACETAMINOPHEN 5-325 MG PO TABS: 1.0000 | ORAL_TABLET | Freq: Four times a day (QID) | ORAL | 0 refills | Status: DC | PRN

## 2019-10-13 MED ORDER — DOCUSATE SODIUM 100 MG PO CAPS: 100.0000 mg | ORAL_CAPSULE | Freq: Two times a day (BID) | ORAL | 0 refills | Status: DC

## 2019-10-13 MED ORDER — SODIUM CHLORIDE 0.9 % IV BOLUS
250.0000 mL | Freq: Once | INTRAVENOUS | Status: AC
  Administered 2019-10-13: 250 mL via INTRAVENOUS

## 2019-10-13 MED ORDER — METHOCARBAMOL 500 MG PO TABS: 500.0000 mg | ORAL_TABLET | Freq: Four times a day (QID) | ORAL | 0 refills | Status: DC | PRN

## 2019-10-13 MED ORDER — POLYETHYLENE GLYCOL 3350 17 G PO PACK: 17.0000 g | PACK | Freq: Two times a day (BID) | ORAL | 0 refills | Status: DC

## 2019-10-13 NOTE — Progress Notes (Signed)
Physical Therapy Treatment Patient Details Name: Shelley Espinoza MRN: [id number] DOB: 03-09-1941 Today's Date: 10/13/2019    History of Present Illness R DA-THA; h/o Parkinsons    PT Comments    Pt ambulated 67' with RW, distance limited by onset of dizziness. Assisted pt to recliner where BP was 81/45, RN notified.   Follow Up Recommendations  Follow surgeon's recommendation for DC plan and follow-up therapies     Equipment Recommendations  None recommended by PT    Recommendations for Other Services       Precautions / Restrictions Precautions Precautions: Fall Restrictions Weight Bearing Restrictions: No    Mobility  Bed Mobility Overal bed mobility: Needs Assistance Bed Mobility: Supine to Sit     Supine to sit: Min assist     General bed mobility comments: min A for RLE  Transfers Overall transfer level: Needs assistance Equipment used: Rolling walker (2 wheeled) Transfers: Sit to/from Stand Sit to Stand: From elevated surface;Min assist         General transfer comment: VCs hand placement, min A to rise and steady, mild posterior lean  Ambulation/Gait Ambulation/Gait assistance: Min guard;Min assist Gait Distance (Feet): 60 Feet Assistive device: Rolling walker (2 wheeled) Gait Pattern/deviations: Step-to pattern;Decreased step length - left;Decreased step length - right Gait velocity: decr   General Gait Details: VCs sequencing, distance limited by dizziness, assisted pt to recliner where BP was 81/45, RN notified   Stairs             Wheelchair Mobility    Modified Rankin (Stroke Patients Only)       Balance Overall balance assessment: Modified Independent                                          Cognition Arousal/Alertness: Awake/alert Behavior During Therapy: WFL for tasks assessed/performed Overall Cognitive Status: Within Functional Limits for tasks assessed                                         Exercises Total Joint Exercises Ankle Circles/Pumps: AROM;Both;10 reps;Supine    General Comments        Pertinent Vitals/Pain Pain Score: 5  Pain Location: L hip Pain Descriptors / Indicators: Sore Pain Intervention(s): Limited activity within patient's tolerance;Monitored during session;Premedicated before session;Ice applied    Home Living                      Prior Function            PT Goals (current goals can now be found in the care plan section) Acute Rehab PT Goals Patient Stated Goal: to be able to go for walks, ride stationary bike PT Goal Formulation: With patient Time For Goal Achievement: 10/19/19 Potential to Achieve Goals: Good    Frequency    7X/week      PT Plan Current plan remains appropriate    Co-evaluation              AM-PAC PT "6 Clicks" Mobility   Outcome Measure  Help needed turning from your back to your side while in a flat bed without using bedrails?: A Little Help needed moving from lying on your back to sitting on the side of a flat bed without using bedrails?: A Little Help  needed moving to and from a bed to a chair (including a wheelchair)?: A Little Help needed standing up from a chair using your arms (e.g., wheelchair or bedside chair)?: A Little Help needed to walk in hospital room?: A Little Help needed climbing 3-5 steps with a railing? : A Lot 6 Click Score: 17    End of Session Equipment Utilized During Treatment: Gait belt Activity Tolerance: Patient tolerated treatment well Patient left: in chair;with call bell/phone within reach;with nursing/sitter in room Nurse Communication: Mobility status PT Visit Diagnosis: Muscle weakness (generalized) (M62.81);Difficulty in walking, not elsewhere classified (R26.2);Pain Pain - Right/Left: Right Pain - part of body: Hip     Time: 2563-8937 PT Time Calculation (min) (ACUTE ONLY): 21 min  Charges:  $Gait Training: 8-22 mins                     Blondell Reveal Kistler PT 10/13/2019  Acute Rehabilitation Services Pager [phone] Office [phone]

## 2019-10-13 NOTE — Progress Notes (Signed)
     Subjective: 1 Day Post-Op Procedure(s) (LRB): TOTAL HIP ARTHROPLASTY ANTERIOR APPROACH (Right)   Patient reports pain as mild/none, pain controlled well.  No reported events throughout the night.  Feels that she is doing quite well.  We discussed the procedure, findings and expectations moving forward.  Patient is ready be discharged home, if she does well therapy.  Patient follow-up in the clinic in 2 weeks.  Patient is to call with any questions or concerns.     Objective:   VITALS:   Vitals:   10/13/19 0128 10/13/19 0513  BP: 109/63 108/60  Pulse: 79 82  Resp: 18 18  Temp: 98.2 F (36.8 C) 98.1 F (36.7 C)  SpO2: 96% 100%    Dorsiflexion/Plantar flexion intact Incision: dressing C/D/I No cellulitis present Compartment soft  LABS Recent Labs    10/13/19 0243  HGB 11.5*  HCT 35.2*  WBC 8.0  PLT 174    Recent Labs    10/13/19 0243  NA 138  K 4.4  BUN 12  CREATININE 0.65  GLUCOSE 147*     Assessment/Plan: 1 Day Post-Op Procedure(s) (LRB): TOTAL HIP ARTHROPLASTY ANTERIOR APPROACH (Right) Foley cath DC'd Advance diet Up with therapy D/C IV fluids Discharge home Follow up in 2 weeks at [facility] Follow up with OLIN,Makaley Storts D in 2 weeks.  Contact information:  [facility] [address] [place] [address] [phone]    Overweight (BMI 25-29.9) Estimated body mass index is 28.85 kg/m as calculated from the following:   Height as of this encounter: 5\' 4"  (1.626 m).   Weight as of this encounter: 76.2 kg. Patient also counseled that weight may inhibit the healing process Patient counseled that losing weight will help with future health issues         West Pugh. Lilyanne Mcquown   PAC  10/13/2019, 8:23 AM

## 2019-10-13 NOTE — Progress Notes (Signed)
Patient discharged to home w/ husb. Given all belongings, instructions. Verbalized understanding of instructions. Escorted to pov via w/c. °

## 2019-10-13 NOTE — Progress Notes (Signed)
Reviewed. Therapy Plan: HEP Has DME  

## 2019-10-13 NOTE — Progress Notes (Signed)
Physical Therapy Treatment Patient Details Name: Shelley Espinoza MRN: [id number] DOB: [birth year] Today's Date: 10/13/2019    History of Present Illness R DA-THA; h/o Parkinsons    PT Comments    Pt is progressing well with mobility. Stair training completed, pt demonstrates good understanding of HEP, she ambulated 120' with RW. No dizziness this session. From PT standpoint, she is ready to DC home.   Follow Up Recommendations  Follow surgeon's recommendation for DC plan and follow-up therapies     Equipment Recommendations  None recommended by PT    Recommendations for Other Services       Precautions / Restrictions Precautions Precautions: Fall Restrictions Weight Bearing Restrictions: No    Mobility  Bed Mobility Overal bed mobility: Needs Assistance Bed Mobility: Supine to Sit     Supine to sit: Min assist     General bed mobility comments: up in recliner  Transfers Overall transfer level: Needs assistance Equipment used: Rolling walker (2 wheeled) Transfers: Sit to/from Stand Sit to Stand: From elevated surface;Min guard         General transfer comment: min/guard for safety  Ambulation/Gait Ambulation/Gait assistance: Min guard Gait Distance (Feet): 120 Feet Assistive device: Rolling walker (2 wheeled) Gait Pattern/deviations: Step-to pattern;Decreased step length - left;Decreased step length - right Gait velocity: decr   General Gait Details: no dizziness, no loss of balance   Stairs Stairs: Yes Stairs assistance: Min assist Stair Management: Step to pattern;Forwards;One rail Right Number of Stairs: 3 General stair comments: VCs sequencing, no loss of balance   Wheelchair Mobility    Modified Rankin (Stroke Patients Only)       Balance Overall balance assessment: Modified Independent                                          Cognition Arousal/Alertness: Awake/alert Behavior During Therapy: WFL for tasks  assessed/performed Overall Cognitive Status: Within Functional Limits for tasks assessed                                        Exercises Total Joint Exercises Ankle Circles/Pumps: AROM;Both;10 reps;Supine Quad Sets: AROM;Right;5 reps;Supine Short Arc Quad: AROM;Right;10 reps;Supine Heel Slides: AAROM;Right;10 reps;Supine Hip ABduction/ADduction: AAROM;Right;10 reps;Supine Long Arc Quad: AROM;Right;Seated;10 reps    General Comments        Pertinent Vitals/Pain Pain Score: 6  Pain Location: R hip Pain Descriptors / Indicators: Sore Pain Intervention(s): Limited activity within patient's tolerance;Monitored during session;Premedicated before session;Ice applied    Home Living                      Prior Function            PT Goals (current goals can now be found in the care plan section) Acute Rehab PT Goals Patient Stated Goal: to be able to go for walks, ride stationary bike PT Goal Formulation: With patient Time For Goal Achievement: 10/19/19 Potential to Achieve Goals: Good Progress towards PT goals: Progressing toward goals    Frequency    7X/week      PT Plan Current plan remains appropriate    Co-evaluation              AM-PAC PT "6 Clicks" Mobility   Outcome Measure  Help needed turning from your back  to your side while in a flat bed without using bedrails?: A Little Help needed moving from lying on your back to sitting on the side of a flat bed without using bedrails?: A Little Help needed moving to and from a bed to a chair (including a wheelchair)?: A Little Help needed standing up from a chair using your arms (e.g., wheelchair or bedside chair)?: A Little Help needed to walk in hospital room?: A Little Help needed climbing 3-5 steps with a railing? : A Little 6 Click Score: 18    End of Session Equipment Utilized During Treatment: Gait belt Activity Tolerance: Patient tolerated treatment well Patient left: in  chair;with call bell/phone within reach Nurse Communication: Mobility status PT Visit Diagnosis: Muscle weakness (generalized) (M62.81);Difficulty in walking, not elsewhere classified (R26.2);Pain Pain - Right/Left: Right Pain - part of body: Hip     Time: 3428-7681 PT Time Calculation (min) (ACUTE ONLY): 40 min  Charges:  $Gait Training: 8-22 mins $Therapeutic Exercise: 8-22 mins $Therapeutic Activity: 8-22 mins                    Shelley Espinoza PT 10/13/2019  Acute Rehabilitation Services Pager [phone] Office [phone]

## 2019-10-14 ENCOUNTER — Encounter: Payer: Self-pay | Admitting: *Deleted

## 2019-10-21 NOTE — Discharge Summary (Signed)
Physician Discharge Summary  Patient ID: Shelley Espinoza MRN: [id number] DOB/AGE: 1941/09/15 78 y.o.  Admit date: 10/12/2019 Discharge date: 10/13/2019   Procedures:  Procedure(s) (LRB): TOTAL HIP ARTHROPLASTY ANTERIOR APPROACH (Right)  Attending Physician:  Dr. Paralee Cancel   Admission Diagnoses:   Right hip primary OA / pain  Discharge Diagnoses:  Principal Problem:   S/P right THA, AA Active Problems:   Overweight (BMI 25.0-29.9)  Past Medical History:  Diagnosis Date  . Arthritis   . High cholesterol   . Overactive bladder   . Parkinson's disease ([place]) 11/18/2018  . Tremor    right night    HPI:    Shelley Espinoza, 78 y.o. female, has a history of pain and functional disability in the right hip(s) due to arthritis and patient has failed non-surgical conservative treatments for greater than 12 weeks to include NSAID's and/or analgesics, corticosteriod injections, use of assistive devices and activity modification.  Onset of symptoms was gradual starting 10 years ago with gradually worsening course since that time.The patient noted no past surgery on the right hip(s).  Patient currently rates pain in the right hip at 5 out of 10 with activity. Patient has worsening of pain with activity and weight bearing, trendelenberg gait, pain that interfers with activities of daily living and pain with passive range of motion. Patient has evidence of periarticular osteophytes and joint space narrowing by imaging studies. This condition presents safety issues increasing the risk of falls.  There is no current active infection.   Risks, benefits and expectations were discussed with the patient.  Risks including but not limited to the risk of anesthesia, blood clots, nerve damage, blood vessel damage, failure of the prosthesis, infection and up to and including death.  Patient understand the risks, benefits and expectations and wishes to proceed with surgery.   PCP: Shelley Rad, MD    Discharged Condition: good  Hospital Course:  Patient underwent the above stated procedure on 10/12/2019. Patient tolerated the procedure well and brought to the recovery room in good condition and subsequently to the floor.  POD #1 BP: 108/60 ; Pulse: 82 ; Temp: 98.1 F (36.7 C) ; Resp: 18 Patient reports pain as mild/none, pain controlled well.  No reported events throughout the night.  Feels that she is doing quite well.  We discussed the procedure, findings and expectations moving forward.  Patient is ready be discharged home. Dorsiflexion/plantar flexion intact, incision: dressing C/D/I, no cellulitis present and compartment soft.   LABS  Basename    HGB     11.5  HCT     35.2    Discharge Exam: General appearance: alert, cooperative and no distress Extremities: Homans sign is negative, no sign of DVT, no edema, redness or tenderness in the calves or thighs and no ulcers, gangrene or trophic changes  Disposition:  Home with follow up in 2 weeks   Follow-up Information    Paralee Cancel, MD. Schedule an appointment as soon as possible for a visit in 2 weeks.   Specialty: Orthopedic Surgery Contact information: [address] [place] [address] [phone]           Discharge Instructions    Call MD / Call 911   Complete by: As directed    If you experience chest pain or shortness of breath, CALL 911 and be transported to the hospital emergency room.  If you develope a fever above 101 F, pus (white drainage) or increased drainage or redness at the  wound, or calf pain, call your surgeon's office.   Change dressing   Complete by: As directed    Maintain surgical dressing until follow up in the clinic. If the edges start to pull up, may reinforce with tape. If the dressing is no longer working, may remove and cover with gauze and tape, but must keep the area dry and clean.  Call with any questions or concerns.   Constipation Prevention   Complete by: As  directed    Drink plenty of fluids.  Prune juice may be helpful.  You may use a stool softener, such as Colace (over the counter) 100 mg twice a day.  Use MiraLax (over the counter) for constipation as needed.   Diet - low sodium heart healthy   Complete by: As directed    Discharge instructions   Complete by: As directed    Maintain surgical dressing until follow up in the clinic. If the edges start to pull up, may reinforce with tape. If the dressing is no longer working, may remove and cover with gauze and tape, but must keep the area dry and clean.  Follow up in 2 weeks at [facility] Call with any questions or concerns.   Increase activity slowly as tolerated   Complete by: As directed    Weight bearing as tolerated with assist device (walker, cane, etc) as directed, use it as long as suggested by your surgeon or therapist, typically at least 4-6 weeks.   TED hose   Complete by: As directed    Use stockings (TED hose) for 2 weeks on both leg(s).  You may remove them at night for sleeping.      Allergies as of 10/13/2019   No Known Allergies     Medication List    STOP taking these medications   bisacodyl 5 MG EC tablet Commonly known as: DULCOLAX   meloxicam 15 MG tablet Commonly known as: MOBIC     TAKE these medications   aspirin 81 MG chewable tablet Commonly known as: Aspirin Childrens Chew 1 tablet (81 mg total) by mouth 2 (two) times daily. Take for 4 weeks, then resume regular dose.   AZO URINARY TRACT SUPPORT PO Take 2 tablets by mouth daily.   Biotin 7500 MCG Tabs Take 7,500 mcg by mouth daily.   CALCIUM 600 + D PO Take 1 tablet by mouth 2 (two) times daily.   carbidopa-levodopa 25-100 MG tablet Commonly known as: SINEMET IR 1/2 tablet 3 times daily for 3 weeks and then take 1 full tablet 3 times daily   diphenhydrAMINE 25 MG tablet Commonly known as: BENADRYL Take 75 mg by mouth at bedtime.   docusate sodium 100 MG capsule Commonly  known as: Colace Take 1 capsule (100 mg total) by mouth 2 (two) times daily. What changed:   when to take this  reasons to take this   ferrous sulfate 325 (65 FE) MG tablet Commonly known as: FerrouSul Take 1 tablet (325 mg total) by mouth 3 (three) times daily with meals for 14 days.   Glucosamine HCl 1500 MG Tabs Take 1,500 mg by mouth daily.   HYDROcodone-acetaminophen 5-325 MG tablet Commonly known as: Norco Take 1-2 tablets by mouth every 6 (six) hours as needed for moderate pain or severe pain.   methocarbamol 500 MG tablet Commonly known as: Robaxin Take 1 tablet (500 mg total) by mouth every 6 (six) hours as needed for muscle spasms.   MULTIVITAMIN PO Take 1 tablet by  mouth daily.   Omega 3 1200 MG Caps Take 1,200 mg by mouth daily.   polyethylene glycol 17 g packet Commonly known as: MIRALAX / GLYCOLAX Take 17 g by mouth 2 (two) times daily.   pramipexole 0.5 MG tablet Commonly known as: Mirapex Take 1 tablet (0.5 mg total) by mouth 3 (three) times daily.   simvastatin 20 MG tablet Commonly known as: ZOCOR Take 20 mg by mouth every other day.   Systane Balance 0.6 % Soln Generic drug: Propylene Glycol Place 1 drop into both eyes daily as needed (dry eyes).   vitamin B-12 1000 MCG tablet Commonly known as: CYANOCOBALAMIN Take 1,000 mcg by mouth daily.   vitamin C 500 MG tablet Commonly known as: ASCORBIC ACID Take 500 mg by mouth daily.   Vitamin D 50 MCG (2000 UT) tablet Take 2,000 Units by mouth daily.            Discharge Care Instructions  (From admission, onward)         Start     Ordered   10/13/19 0000  Change dressing    Comments: Maintain surgical dressing until follow up in the clinic. If the edges start to pull up, may reinforce with tape. If the dressing is no longer working, may remove and cover with gauze and tape, but must keep the area dry and clean.  Call with any questions or concerns.   10/13/19 [id number]            Signed: Anastasio AuerbachMatthew S. Roch Quach   PA-C  10/21/2019, 5:07 PM

## 2019-12-06 ENCOUNTER — Other Ambulatory Visit: Payer: Self-pay

## 2019-12-06 ENCOUNTER — Encounter: Payer: Self-pay | Admitting: Neurology

## 2019-12-06 ENCOUNTER — Ambulatory Visit (INDEPENDENT_AMBULATORY_CARE_PROVIDER_SITE_OTHER): Payer: Medicare Other | Admitting: Neurology

## 2019-12-06 VITALS — BP 119/73 | HR 71 | Temp 96.0°F | Ht 64.0 in | Wt 166.0 lb

## 2019-12-06 DIAGNOSIS — F5104 Psychophysiologic insomnia: Secondary | ICD-10-CM | POA: Diagnosis not present

## 2019-12-06 DIAGNOSIS — G20 Parkinson's disease: Secondary | ICD-10-CM | POA: Diagnosis not present

## 2019-12-06 HISTORY — DX: Psychophysiologic insomnia: F51.04

## 2019-12-06 MED ORDER — CARBIDOPA-LEVODOPA 25-100 MG PO TABS: ORAL_TABLET | ORAL | 1 refills | Status: DC

## 2019-12-06 MED ORDER — PRAMIPEXOLE DIHYDROCHLORIDE 0.5 MG PO TABS: 0.5000 mg | ORAL_TABLET | Freq: Three times a day (TID) | ORAL | 1 refills | Status: DC

## 2019-12-06 NOTE — Progress Notes (Signed)
Reason for visit: Parkinson's disease  Shelley Espinoza is an 79 y.o. female  History of present illness:  Shelley Espinoza is an 79 year old right-handed white female with a history of Parkinson's disease primarily with right-sided features.  The patient has a significant resting tremor on the right arm.  She recently had a total hip replacement on the right on 12 October 2019.  This has helped her ability to get around and increased her mobility.  At some point in the future she will be having the left hip done as well.  She reports no falls.  She just recently started the Sinemet, she is on 1 full tablet of the 25/100 mg tablets 3 times daily.  Unfortunately when she started the Sinemet she stopped the Mirapex.  The patient is having difficulty sleeping at night in part secondary to the tremor.  She has taken Benadryl 100 mg at night which seemed to help initially but now is wearing off.  She will be trying hemp oil for sleep in the near future.   Past Medical History:  Diagnosis Date  . Arthritis   . High cholesterol   . Overactive bladder   . Parkinson's disease ([facility]) 11/18/2018  . Tremor    right night    Past Surgical History:  Procedure Laterality Date  . CATARACT EXTRACTION, BILATERAL     age 45  . COSMETIC SURGERY    . martins nueroma     feet  . TOTAL HIP ARTHROPLASTY Right 10/12/2019   Procedure: TOTAL HIP ARTHROPLASTY ANTERIOR APPROACH;  Surgeon: Durene Romans, MD;  Location: [facility];  Service: Orthopedics;  Laterality: Right;  70 mins  . TUBAL LIGATION    . VAGINAL DELIVERY      Family History  Problem Relation Age of Onset  . Stroke Mother   . Hypertension Father   . Heart disease Father   . Schizophrenia Sister   . Parkinsonism Brother     Social history:  reports that she has quit smoking. She has never used smokeless tobacco. She reports current alcohol use of about 1.0 standard drinks of alcohol per week. She reports that she does not use drugs.   No Known  Allergies  Medications:  Prior to Admission medications   Medication Sig Start Date End Date Taking? Authorizing Provider  Biotin 7500 MCG TABS Take 7,500 mcg by mouth daily.   Yes [provider]  Calcium Carb-Cholecalciferol (CALCIUM 600 + D PO) Take 1 tablet by mouth 2 (two) times daily.   Yes [provider]  carbidopa-levodopa (SINEMET IR) 25-100 MG tablet 1/2 tablet 3 times daily for 3 weeks and then take 1 full tablet 3 times daily Patient taking differently: and then take 1 full tablet 3 times daily 07/26/19  Yes York Spaniel, MD  Cholecalciferol (VITAMIN D) 50 MCG (2000 UT) tablet Take 2,000 Units by mouth daily.   Yes [provider]  diphenhydrAMINE (BENADRYL) 25 MG tablet Take 75 mg by mouth at bedtime.   Yes [provider]  Multiple Vitamins-Minerals (MULTIVITAMIN PO) Take 1 tablet by mouth daily.   Yes [provider]  Omega 3 1200 MG CAPS Take 1,200 mg by mouth daily.   Yes [provider]  Phenazopyrid-Cranbry-C-Probiot (AZO URINARY TRACT SUPPORT PO) Take 2 tablets by mouth daily.   Yes [provider]  pramipexole (MIRAPEX) 0.5 MG tablet Take 1 tablet (0.5 mg total) by mouth 3 (three) times daily. 06/03/19  Yes York Spaniel, MD  Propylene Glycol (SYSTANE BALANCE) 0.6 % SOLN Place 1 drop into both eyes daily as needed (dry eyes).   Yes [provider]  simvastatin (ZOCOR) 20 MG tablet Take 20 mg by mouth every other day.  04/13/18  Yes [provider]  vitamin B-12 (CYANOCOBALAMIN) 1000 MCG tablet Take 1,000 mcg by mouth daily.   Yes [provider]  vitamin C (ASCORBIC ACID) 500 MG tablet Take 500 mg by mouth daily.   Yes [provider]    ROS:  Out of a complete 14 system review of symptoms, the patient complains only of the following symptoms, and all other reviewed systems are negative.  Tremor Walking difficulty Insomnia  Blood pressure 119/73, pulse 71,  temperature (!) 96 F (35.6 C), height 5\' 4"  (1.626 m), weight 166 lb (75.3 kg).  Physical Exam  General: The patient is alert and cooperative at the time of the examination.  Skin: No significant peripheral edema is noted.   Neurologic Exam  Mental status: The patient is alert and oriented x 3 at the time of the examination. The patient has apparent normal recent and remote memory, with an apparently normal attention span and concentration ability.   Cranial nerves: Facial symmetry is present. Speech is normal, no aphasia or dysarthria is noted. Extraocular movements are full. Visual fields are full.  Mild masking the face is seen.  Motor: The patient has good strength in all 4 extremities.  Sensory examination: Soft touch sensation is symmetric on the face, arms, and legs.  Coordination: The patient has good finger-nose-finger and heel-to-shin bilaterally.  A prominent resting tremor of the right upper extremity is noted.  Gait and station: The patient has a slightly wide-based gait, the patient usually uses a cane for ambulation.  Decreased arm swing on the right is noted, tremors noted on the right arm.  Tandem gait was not tested.  Romberg is negative but is slightly unsteady.  Reflexes: Deep tendon reflexes are symmetric.   Assessment/Plan:  1.  Parkinson's disease, right upper extremity tremor  2.  Gait disorder  3.  Chronic insomnia  The patient will try hemp oil for her insomnia, if this is not effective, she will contact our office and we will consider the use of trazodone.  The patient will go back on Mirapex, a prescription for the Sinemet and Mirapex were given.  The patient will follow-up here in 6 months.  She is to remain active.   Jill Alexanders MD 12/06/2019 12:21 PM  [facility] [address] [place], [place] [address]  Phone [phone] Fax [phone]

## 2019-12-31 NOTE — H&P (Signed)
TOTAL HIP ADMISSION H&P  Patient is admitted for left total hip arthroplasty, anterior approach.  Subjective:  Chief Complaint:    Left hip primary OA / pain  HPI: Shelley Espinoza, 79 y.o. female, has a history of pain and functional disability in the left hip(s) due to arthritis and patient has failed non-surgical conservative treatments for greater than 12 weeks to include NSAID's and/or analgesics, corticosteriod injections, use of assistive devices and activity modification.  Onset of symptoms was gradual starting 10 years ago with gradually worsening course since that time.The patient noted prior procedures of the hip to include arthroplasty on the right hip(s).  Patient currently rates pain in the left hip at 5 out of 10 with activity. Patient has worsening of pain with activity and weight bearing, trendelenberg gait, pain that interfers with activities of daily living and pain with passive range of motion. Patient has evidence of periarticular osteophytes and joint space narrowing by imaging studies. This condition presents safety issues increasing the risk of falls.   There is no current active infection.  Risks, benefits and expectations were discussed with the patient.  Risks including but not limited to the risk of anesthesia, blood clots, nerve damage, blood vessel damage, failure of the prosthesis, infection and up to and including death.  Patient understand the risks, benefits and expectations and wishes to proceed with surgery.   PCP: Aggie Cosier, MD  D/C Plans:       Home (obs)  Post-op Meds:       No Rx given  Tranexamic Acid:      To be given - IV   Decadron:      Is to be given  FYI:      ASA  Norco  DME:   Pt already has equipment   PT:   HEP  Pharmacy: [facility] [place], [place]    Patient Active Problem List   Diagnosis Date Noted  . Chronic insomnia 12/06/2019  . Overweight (BMI 25.0-29.9) 10/13/2019  . S/P right THA, AA 10/12/2019  . Elevated liver enzymes  03/02/2019  . Pancreatic cyst 03/02/2019  . Parkinson's disease ([facility]) 11/18/2018   Past Medical History:  Diagnosis Date  . Arthritis   . Chronic insomnia 12/06/2019  . High cholesterol   . Overactive bladder   . Parkinson's disease ([facility]) 11/18/2018  . Tremor    right night    Past Surgical History:  Procedure Laterality Date  . CATARACT EXTRACTION, BILATERAL     age 48  . COSMETIC SURGERY    . martins nueroma     feet  . TOTAL HIP ARTHROPLASTY Right 10/12/2019   Procedure: TOTAL HIP ARTHROPLASTY ANTERIOR APPROACH;  Surgeon: Durene Romans, MD;  Location: [facility];  Service: Orthopedics;  Laterality: Right;  70 mins  . TUBAL LIGATION    . VAGINAL DELIVERY      No current facility-administered medications for this encounter.   Current Outpatient Medications  Medication Sig Dispense Refill Last Dose  . Biotin 7500 MCG TABS Take 7,500 mcg by mouth daily.     . Calcium Carb-Cholecalciferol (CALCIUM 600 + D PO) Take 1 tablet by mouth 2 (two) times daily.     . carbidopa-levodopa (SINEMET IR) 25-100 MG tablet 1 full tablet 3 times daily 270 tablet 1   . Cholecalciferol (VITAMIN D) 50 MCG (2000 UT) tablet Take 2,000 Units by mouth daily.     . diphenhydrAMINE (BENADRYL) 25 MG tablet Take 75 mg by mouth at bedtime.     Marland Kitchen  Multiple Vitamins-Minerals (MULTIVITAMIN PO) Take 1 tablet by mouth daily.     . Omega 3 1200 MG CAPS Take 1,200 mg by mouth daily.     . Phenazopyrid-Cranbry-C-Probiot (AZO URINARY TRACT SUPPORT PO) Take 2 tablets by mouth daily.     . pramipexole (MIRAPEX) 0.5 MG tablet Take 1 tablet (0.5 mg total) by mouth 3 (three) times daily. 270 tablet 1   . Propylene Glycol (SYSTANE BALANCE) 0.6 % SOLN Place 1 drop into both eyes daily as needed (dry eyes).     . simvastatin (ZOCOR) 20 MG tablet Take 20 mg by mouth every other day.   5   . vitamin B-12 (CYANOCOBALAMIN) 1000 MCG tablet Take 1,000 mcg by mouth daily.     . vitamin C (ASCORBIC ACID) 500 MG tablet Take 500 mg by mouth  daily.      No Known Allergies   Social History   Tobacco Use  . Smoking status: Former Games developer  . Smokeless tobacco: Never Used  . Tobacco comment: Quit 1980   off and on for 10 years  Substance Use Topics  . Alcohol use: Yes    Alcohol/week: 1.0 standard drinks    Types: 1 Glasses of wine per week    Comment: 1 cocktail before dinner    Family History  Problem Relation Age of Onset  . Stroke Mother   . Hypertension Father   . Heart disease Father   . Schizophrenia Sister   . Parkinsonism Brother      Review of Systems  Constitutional: Negative.   HENT: Negative.   Eyes: Negative.   Respiratory: Negative.   Cardiovascular: Negative.   Gastrointestinal: Positive for constipation.  Genitourinary: Negative.   Musculoskeletal: Positive for joint pain.  Skin: Negative.   Neurological: Positive for tremors.  Endo/Heme/Allergies: Negative.   Psychiatric/Behavioral: Negative.      Objective:  Physical Exam  Constitutional: She is oriented to person, place, and time. She appears well-developed.  HENT:  Head: Normocephalic.  Eyes: Pupils are equal, round, and reactive to light.  Neck: No JVD present. No tracheal deviation present. No thyromegaly present.  Cardiovascular: Normal rate, regular rhythm and intact distal pulses.  Respiratory: Effort normal and breath sounds normal. No respiratory distress. She has no wheezes.  GI: Soft. There is no abdominal tenderness. There is no guarding.  Musculoskeletal:     Cervical back: Neck supple.     Left hip: Tenderness and bony tenderness present. No swelling, deformity or lacerations. Decreased range of motion. Decreased strength.  Lymphadenopathy:    She has no cervical adenopathy.  Neurological: She is alert and oriented to person, place, and time. A sensory deficit (occasional faint numbness in toes) is present.  Skin: Skin is warm and dry.  Psychiatric: She has a normal mood and affect.     Labs:  Estimated body  mass index is 28.49 kg/m as calculated from the following:   Height as of 12/06/19: 5\' 4"  (1.626 m).   Weight as of 12/06/19: 75.3 kg.   Imaging Review Plain radiographs demonstrate severe degenerative joint disease of the left hip. The bone quality appears to be good for age and reported activity level.      Assessment/Plan:  End stage arthritis, left hip  The patient history, physical examination, clinical judgement of the provider and imaging studies are consistent with end stage degenerative joint disease of the left hip and total hip arthroplasty is deemed medically necessary. The treatment options including medical management, injection therapy,  arthroscopy and arthroplasty were discussed at length. The risks and benefits of total hip arthroplasty were presented and reviewed. The risks due to aseptic loosening, infection, stiffness, dislocation/subluxation,  thromboembolic complications and other imponderables were discussed.  The patient acknowledged the explanation, agreed to proceed with the plan and consent was signed. Patient is being admitted for inpatient treatment for surgery, pain control, PT, OT, prophylactic antibiotics, VTE prophylaxis, progressive ambulation and ADL's and discharge planning.The patient is planning to be discharged home.     [place] P[place] Taeshaun Rames   PA-C  12/31/2019, 11:31 AM

## 2020-01-10 NOTE — Patient Instructions (Addendum)
DUE TO COVID-19 ONLY ONE VISITOR IS ALLOWED TO COME WITH YOU AND STAY IN THE WAITING ROOM ONLY DURING PRE OP AND PROCEDURE DAY OF SURGERY. THE 1 VISITOR MAY VISIT WITH YOU AFTER SURGERY IN YOUR PRIVATE ROOM DURING VISITING HOURS ONLY!  YOU NEED TO HAVE A COVID 19 TEST ON_Friday 03/12/2021______ @__1145  am_____, THIS TEST MUST BE DONE BEFORE SURGERY, COME  [place] , [address].  ([place]) ONCE YOUR COVID TEST IS COMPLETED, PLEASE BEGIN THE QUARANTINE INSTRUCTIONS AS OUTLINED IN YOUR HANDOUT.                Shelley Espinoza     Your procedure is scheduled on: Tuesday 01/18/2020   Report to [facility] Main  Entrance    Report to admitting at 0610  AM     Call this number if you have problems the morning of surgery [phone]    Remember: Do not eat food  :After Midnight.     NO SOLID FOOD AFTER MIDNIGHT THE NIGHT PRIOR TO SURGERY  And    NOTHING BY MOUTH EXCEPT CLEAR LIQUIDS UNTIL  0540 am .     PLEASE FINISH ENSURE DRINK PER SURGEON ORDER  WHICH NEEDS TO BE COMPLETED AT  0540 am .   CLEAR LIQUID DIET   Foods Allowed                                                                     Foods Excluded  Coffee and tea, regular and decaf                             liquids that you cannot  Plain Jell-O any favor except red or purple                                           see through such as: Fruit ices (not with fruit pulp)                                     milk, soups, orange juice  Iced Popsicles                                    All solid food Carbonated beverages, regular and diet                                    Cranberry, grape and apple juices Sports drinks like Gatorade Lightly seasoned clear broth or consume(fat free) Sugar, honey syrup  Sample Menu Breakfast                                Lunch  Supper Cranberry juice                    Beef broth                             Chicken broth Jell-O                                     Grape juice                           Apple juice Coffee or tea                        Jell-O                                      Popsicle                                                Coffee or tea                        Coffee or tea  _____________________________________________________________________     BRUSH YOUR TEETH MORNING OF SURGERY AND RINSE YOUR MOUTH OUT, NO CHEWING GUM CANDY OR MINTS.     Take these medicines the morning of surgery with A SIP OF WATER: Carbadopa-Levodopa (Sinemet IR), Pramipexole (Mirapex)                                 You may not have any metal on your body including hair pins and              piercings  Do not wear jewelry, make-up, lotions, powders or perfumes, deodorant             Do not wear nail polish on your fingernails.  Do not shave  48 hours prior to surgery.                Do not bring valuables to the hospital. [facility] IS NOT             RESPONSIBLE   FOR VALUABLES.  Contacts, dentures or bridgework may not be worn into surgery.  Leave suitcase in the car. After surgery it may be brought to your room.                  Please read over the following fact sheets you were given: _____________________________________________________________________             [facility] - Preparing for Surgery Before surgery, you can play an important role.  Because skin is not sterile, your skin needs to be as free of germs as possible.  You can reduce the number of germs on your skin by washing with CHG (chlorahexidine gluconate) soap before surgery.  CHG is an antiseptic cleaner which kills germs and bonds with the skin to continue killing germs even after washing. Please DO NOT use if you have an allergy to CHG or antibacterial  soaps.  If your skin becomes reddened/irritated stop using the CHG and inform your nurse when you arrive at Short Stay. Do not shave (including legs and  underarms) for at least 48 hours prior to the first CHG shower.  You may shave your face/neck. Please follow these instructions carefully:  1.  Shower with CHG Soap the night before surgery and the  morning of Surgery.  2.  If you choose to wash your hair, wash your hair first as usual with your  normal  shampoo.  3.  After you shampoo, rinse your hair and body thoroughly to remove the  shampoo.                           4.  Use CHG as you would any other liquid soap.  You can apply chg directly  to the skin and wash                       Gently with a scrungie or clean washcloth.  5.  Apply the CHG Soap to your body ONLY FROM THE NECK DOWN.   Do not use on face/ open                           Wound or open sores. Avoid contact with eyes, ears mouth and genitals (private parts).                       Wash face,  Genitals (private parts) with your normal soap.             6.  Wash thoroughly, paying special attention to the area where your surgery  will be performed.  7.  Thoroughly rinse your body with warm water from the neck down.  8.  DO NOT shower/wash with your normal soap after using and rinsing off  the CHG Soap.                9.  Pat yourself dry with a clean towel.            10.  Wear clean pajamas.            11.  Place clean sheets on your bed the night of your first shower and do not  sleep with pets. Day of Surgery : Do not apply any lotions/deodorants the morning of surgery.  Please wear clean clothes to the hospital/surgery center.  FAILURE TO FOLLOW THESE INSTRUCTIONS MAY RESULT IN THE CANCELLATION OF YOUR SURGERY PATIENT SIGNATURE_________________________________  NURSE SIGNATURE__________________________________  ________________________________________________________________________   Shelley Espinoza  An incentive spirometer is a tool that can help keep your lungs clear and active. This tool measures how well you are filling your lungs with each breath. Taking  long deep breaths may help reverse or decrease the chance of developing breathing (pulmonary) problems (especially infection) following:  A long period of time when you are unable to move or be active. BEFORE THE PROCEDURE   If the spirometer includes an indicator to show your best effort, your nurse or respiratory therapist will set it to a desired goal.  If possible, sit up straight or lean slightly forward. Try not to slouch.  Hold the incentive spirometer in an upright position. INSTRUCTIONS FOR USE  1. Sit on the edge of your bed if possible, or sit up as far as you can in bed  or on a chair. 2. Hold the incentive spirometer in an upright position. 3. Breathe out normally. 4. Place the mouthpiece in your mouth and seal your lips tightly around it. 5. Breathe in slowly and as deeply as possible, raising the piston or the ball toward the top of the column. 6. Hold your breath for 3-5 seconds or for as long as possible. Allow the piston or ball to fall to the bottom of the column. 7. Remove the mouthpiece from your mouth and breathe out normally. 8. Rest for a few seconds and repeat Steps 1 through 7 at least 10 times every 1-2 hours when you are awake. Take your time and take a few normal breaths between deep breaths. 9. The spirometer may include an indicator to show your best effort. Use the indicator as a goal to work toward during each repetition. 10. After each set of 10 deep breaths, practice coughing to be sure your lungs are clear. If you have an incision (the cut made at the time of surgery), support your incision when coughing by placing a pillow or rolled up towels firmly against it. Once you are able to get out of bed, walk around indoors and cough well. You may stop using the incentive spirometer when instructed by your caregiver.  RISKS AND COMPLICATIONS  Take your time so you do not get dizzy or light-headed.  If you are in pain, you may need to take or ask for pain  medication before doing incentive spirometry. It is harder to take a deep breath if you are having pain. AFTER USE  Rest and breathe slowly and easily.  It can be helpful to keep track of a log of your progress. Your caregiver can provide you with a simple table to help with this. If you are using the spirometer at home, follow these instructions: SEEK MEDICAL CARE IF:   You are having difficultly using the spirometer.  You have trouble using the spirometer as often as instructed.  Your pain medication is not giving enough relief while using the spirometer.  You develop fever of 100.5 F (38.1 C) or higher. SEEK IMMEDIATE MEDICAL CARE IF:   You cough up bloody sputum that had not been present before.  You develop fever of 102 F (38.9 C) or greater.  You develop worsening pain at or near the incision site. MAKE SURE YOU:   Understand these instructions.  Will watch your condition.  Will get help right away if you are not doing well or get worse. Document Released: 03/03/2007 Document Revised: 01/13/2012 Document Reviewed: 05/04/2007 Exit[employer] Patient Information 2014 [employer], [place].   ________________________________________________________________________  WHAT IS A BLOOD TRANSFUSION? Blood Transfusion Information  A transfusion is the replacement of blood or some of its parts. Blood is made up of multiple cells which provide different functions.  Red blood cells carry oxygen and are used for blood loss replacement.  White blood cells fight against infection.  Platelets control bleeding.  Plasma helps clot blood.  Other blood products are available for specialized needs, such as hemophilia or other clotting disorders. BEFORE THE TRANSFUSION  Who gives blood for transfusions?   Healthy volunteers who are fully evaluated to make sure their blood is safe. This is blood bank blood. Transfusion therapy is the safest it has ever been in the practice of medicine.  Before blood is taken from a donor, a complete history is taken to make sure that person has no history of diseases nor engages in risky  social behavior (examples are intravenous drug use or sexual activity with multiple partners). The donor's travel history is screened to minimize risk of transmitting infections, such as malaria. The donated blood is tested for signs of infectious diseases, such as HIV and hepatitis. The blood is then tested to be sure it is compatible with you in order to minimize the chance of a transfusion reaction. If you or a relative donates blood, this is often done in anticipation of surgery and is not appropriate for emergency situations. It takes many days to process the donated blood. RISKS AND COMPLICATIONS Although transfusion therapy is very safe and saves many lives, the main dangers of transfusion include:   Getting an infectious disease.  Developing a transfusion reaction. This is an allergic reaction to something in the blood you were given. Every precaution is taken to prevent this. The decision to have a blood transfusion has been considered carefully by your caregiver before blood is given. Blood is not given unless the benefits outweigh the risks. AFTER THE TRANSFUSION  Right after receiving a blood transfusion, you will usually feel much better and more energetic. This is especially true if your red blood cells have gotten low (anemic). The transfusion raises the level of the red blood cells which carry oxygen, and this usually causes an energy increase.  The nurse administering the transfusion will monitor you carefully for complications. HOME CARE INSTRUCTIONS  No special instructions are needed after a transfusion. You may find your energy is better. Speak with your caregiver about any limitations on activity for underlying diseases you may have. SEEK MEDICAL CARE IF:   Your condition is not improving after your transfusion.  You develop redness or  irritation at the intravenous (IV) site. SEEK IMMEDIATE MEDICAL CARE IF:  Any of the following symptoms occur over the next 12 hours:  Shaking chills.  You have a temperature by mouth above 102 F (38.9 C), not controlled by medicine.  Chest, back, or muscle pain.  People around you feel you are not acting correctly or are confused.  Shortness of breath or difficulty breathing.  Dizziness and fainting.  You get a rash or develop hives.  You have a decrease in urine output.  Your urine turns a dark color or changes to pink, red, or brown. Any of the following symptoms occur over the next 10 days:  You have a temperature by mouth above 102 F (38.9 C), not controlled by medicine.  Shortness of breath.  Weakness after normal activity.  The white part of the eye turns yellow (jaundice).  You have a decrease in the amount of urine or are urinating less often.  Your urine turns a dark color or changes to pink, red, or brown. Document Released: 10/18/2000 Document Revised: 01/13/2012 Document Reviewed: 06/06/2008 [facility] Patient Information 2014 [place], [place].  _______________________________________________________________________

## 2020-01-11 ENCOUNTER — Other Ambulatory Visit: Payer: Self-pay

## 2020-01-11 ENCOUNTER — Encounter (HOSPITAL_COMMUNITY)
Admission: RE | Admit: 2020-01-11 | Discharge: 2020-01-11 | Disposition: A | Discharge status: Home / Self Care | Payer: Medicare Other | Source: Ambulatory Visit | Attending: Orthopedic Surgery | Admitting: Orthopedic Surgery

## 2020-01-11 ENCOUNTER — Encounter (HOSPITAL_COMMUNITY): Payer: Self-pay

## 2020-01-11 DIAGNOSIS — Z01812 Encounter for preprocedural laboratory examination: Secondary | ICD-10-CM | POA: Insufficient documentation

## 2020-01-11 DIAGNOSIS — G20 Parkinson's disease: Secondary | ICD-10-CM | POA: Diagnosis not present

## 2020-01-11 DIAGNOSIS — Z20822 Contact with and (suspected) exposure to covid-19: Secondary | ICD-10-CM | POA: Diagnosis not present

## 2020-01-11 NOTE — Progress Notes (Signed)
PCP - Dr. Adriana Mccallum, [place] Cardiologist -n/a  Neurology- Dr. Stephanie Acre  (for Parkinson's)  LOV-12/06/2019 epic   Chest x-ray - 10/2019 at Dr. Celine Mans EKG - 10/2019 at Dr. Celine Mans Stress Test - n/a ECHO - n/a Cardiac Cath - n/a  Sleep Study - n/a CPAP - n/a  Fasting Blood Sugar - n/a Checks Blood Sugar __0___ times a day  Blood Thinner Instructions:n/a Aspirin Instructions:n/a Last Dose:n/a  Anesthesia review:  Patient has a history of Parkinson's disease.  Patient denies shortness of breath, fever, cough and chest pain at PAT appointment   Patient verbalized understanding of instructions that were given to them at the PAT appointment. Patient was also instructed that they will need to review over the PAT instructions again at home before surgery.

## 2020-01-12 ENCOUNTER — Other Ambulatory Visit (HOSPITAL_COMMUNITY): Payer: Medicare Other

## 2020-01-14 ENCOUNTER — Encounter (HOSPITAL_COMMUNITY)
Admission: RE | Admit: 2020-01-14 | Discharge: 2020-01-14 | Disposition: A | Discharge status: Home / Self Care | Payer: Medicare Other | Source: Ambulatory Visit | Attending: Orthopedic Surgery | Admitting: Orthopedic Surgery

## 2020-01-14 ENCOUNTER — Other Ambulatory Visit: Payer: Self-pay

## 2020-01-14 ENCOUNTER — Other Ambulatory Visit (HOSPITAL_COMMUNITY)
Admission: RE | Admit: 2020-01-14 | Discharge: 2020-01-14 | Disposition: A | Discharge status: Home / Self Care | Payer: Medicare Other | Source: Ambulatory Visit | Attending: Orthopedic Surgery | Admitting: Orthopedic Surgery

## 2020-01-14 DIAGNOSIS — Z01812 Encounter for preprocedural laboratory examination: Secondary | ICD-10-CM | POA: Diagnosis not present

## 2020-01-14 LAB — CBC
HCT: 42 % (ref 36.0–46.0)
Hemoglobin: 13.7 g/dL (ref 12.0–15.0)
MCH: 32.6 pg (ref 26.0–34.0)
MCHC: 32.6 g/dL (ref 30.0–36.0)
MCV: 100 fL (ref 80.0–100.0)
Platelets: 240 10*3/uL (ref 150–400)
RBC: 4.2 MIL/uL (ref 3.87–5.11)
RDW: 13.4 % (ref 11.5–15.5)
WBC: 5.4 10*3/uL (ref 4.0–10.5)
nRBC: 0 % (ref 0.0–0.2)

## 2020-01-14 LAB — BASIC METABOLIC PANEL
Anion gap: 8 (ref 5–15)
BUN: 20 mg/dL (ref 8–23)
CO2: 25 mmol/L (ref 22–32)
Calcium: 9.2 mg/dL (ref 8.9–10.3)
Chloride: 105 mmol/L (ref 98–111)
Creatinine, Ser: 0.68 mg/dL (ref 0.44–1.00)
GFR calc Af Amer: >60 mL/min (ref >60)
GFR calc non Af Amer: >60 mL/min (ref >60)
Glucose, Bld: 104 mg/dL — ABNORMAL HIGH (ref 70–99)
Potassium: 4.1 mmol/L (ref 3.5–5.1)
Sodium: 138 mmol/L (ref 135–145)

## 2020-01-14 LAB — SURGICAL PCR SCREEN
MRSA, PCR: NEGATIVE
Staphylococcus aureus: NEGATIVE

## 2020-01-14 LAB — SARS CORONAVIRUS 2 (TAT 6-24 HRS): SARS Coronavirus 2: NEGATIVE

## 2020-01-18 ENCOUNTER — Other Ambulatory Visit: Payer: Self-pay

## 2020-01-18 ENCOUNTER — Ambulatory Visit (HOSPITAL_COMMUNITY): Payer: Medicare Other

## 2020-01-18 ENCOUNTER — Encounter (HOSPITAL_COMMUNITY): Payer: Self-pay | Admitting: Orthopedic Surgery

## 2020-01-18 ENCOUNTER — Ambulatory Visit (HOSPITAL_COMMUNITY): Payer: Medicare Other | Admitting: Certified Registered Nurse Anesthetist

## 2020-01-18 ENCOUNTER — Encounter (HOSPITAL_COMMUNITY)
Admission: RE | Disposition: A | Discharge status: Home / Self Care | Payer: Self-pay | Source: Home / Self Care | Attending: Orthopedic Surgery

## 2020-01-18 ENCOUNTER — Observation Stay (HOSPITAL_COMMUNITY)
Admission: RE | Admit: 2020-01-18 | Discharge: 2020-01-19 | Disposition: A | Discharge status: Home / Self Care | Payer: Medicare Other | Attending: Orthopedic Surgery | Admitting: Orthopedic Surgery

## 2020-01-18 ENCOUNTER — Observation Stay (HOSPITAL_COMMUNITY): Payer: Medicare Other

## 2020-01-18 DIAGNOSIS — Z87891 Personal history of nicotine dependence: Secondary | ICD-10-CM | POA: Insufficient documentation

## 2020-01-18 DIAGNOSIS — M1612 Unilateral primary osteoarthritis, left hip: Principal | ICD-10-CM | POA: Insufficient documentation

## 2020-01-18 DIAGNOSIS — Z96641 Presence of right artificial hip joint: Secondary | ICD-10-CM | POA: Diagnosis not present

## 2020-01-18 DIAGNOSIS — E785 Hyperlipidemia, unspecified: Secondary | ICD-10-CM | POA: Diagnosis not present

## 2020-01-18 DIAGNOSIS — G20 Parkinson's disease: Secondary | ICD-10-CM | POA: Diagnosis not present

## 2020-01-18 DIAGNOSIS — Z419 Encounter for procedure for purposes other than remedying health state, unspecified: Secondary | ICD-10-CM

## 2020-01-18 DIAGNOSIS — Z96642 Presence of left artificial hip joint: Secondary | ICD-10-CM

## 2020-01-18 DIAGNOSIS — Z96649 Presence of unspecified artificial hip joint: Secondary | ICD-10-CM

## 2020-01-18 DIAGNOSIS — E663 Overweight: Secondary | ICD-10-CM | POA: Diagnosis present

## 2020-01-18 DIAGNOSIS — Z79899 Other long term (current) drug therapy: Secondary | ICD-10-CM | POA: Insufficient documentation

## 2020-01-18 HISTORY — PX: TOTAL HIP ARTHROPLASTY: SHX124

## 2020-01-18 LAB — TYPE AND SCREEN
ABO/RH(D): O POS
Antibody Screen: NEGATIVE

## 2020-01-18 SURGERY — ARTHROPLASTY, HIP, TOTAL, ANTERIOR APPROACH
Anesthesia: Spinal | Site: Hip | Laterality: Left

## 2020-01-18 MED ORDER — 0.9 % SODIUM CHLORIDE (POUR BTL) OPTIME
TOPICAL | Status: DC | PRN
  Administered 2020-01-18: 1000 mL

## 2020-01-18 MED ORDER — CARBIDOPA-LEVODOPA 25-100 MG PO TABS
1.0000 | ORAL_TABLET | Freq: Three times a day (TID) | ORAL | Status: DC
  Administered 2020-01-18 – 2020-01-19 (×3): 1 via ORAL
  Filled 2020-01-18 (×3): qty 1

## 2020-01-18 MED ORDER — FENTANYL CITRATE (PF) 100 MCG/2ML IJ SOLN
INTRAMUSCULAR | Status: AC
  Filled 2020-01-18: qty 2

## 2020-01-18 MED ORDER — PHENYLEPHRINE HCL (PRESSORS) 10 MG/ML IV SOLN
INTRAVENOUS | Status: AC
  Filled 2020-01-18: qty 1

## 2020-01-18 MED ORDER — DEXAMETHASONE SODIUM PHOSPHATE 10 MG/ML IJ SOLN
10.0000 mg | Freq: Once | INTRAMUSCULAR | Status: AC
  Administered 2020-01-19: 10 mg via INTRAVENOUS
  Filled 2020-01-18: qty 1

## 2020-01-18 MED ORDER — HYDROCODONE-ACETAMINOPHEN 5-325 MG PO TABS
1.0000 | ORAL_TABLET | ORAL | Status: DC | PRN
  Administered 2020-01-18 – 2020-01-19 (×5): 2 via ORAL
  Filled 2020-01-18 (×5): qty 2

## 2020-01-18 MED ORDER — METOCLOPRAMIDE HCL 5 MG/ML IJ SOLN: 5.0000 mg | Freq: Three times a day (TID) | INTRAMUSCULAR | Status: DC | PRN

## 2020-01-18 MED ORDER — CEFAZOLIN SODIUM-DEXTROSE 2-4 GM/100ML-% IV SOLN
2.0000 g | INTRAVENOUS | Status: AC
  Administered 2020-01-18: 2 g via INTRAVENOUS
  Filled 2020-01-18: qty 100

## 2020-01-18 MED ORDER — ONDANSETRON HCL 4 MG/2ML IJ SOLN: 4.0000 mg | Freq: Four times a day (QID) | INTRAMUSCULAR | Status: DC | PRN

## 2020-01-18 MED ORDER — BISACODYL 10 MG RE SUPP: 10.0000 mg | Freq: Every day | RECTAL | Status: DC | PRN

## 2020-01-18 MED ORDER — CHLORHEXIDINE GLUCONATE 4 % EX LIQD: 60.0000 mL | Freq: Once | CUTANEOUS | Status: DC

## 2020-01-18 MED ORDER — DOCUSATE SODIUM 100 MG PO CAPS
100.0000 mg | ORAL_CAPSULE | Freq: Two times a day (BID) | ORAL | Status: DC
  Administered 2020-01-18 – 2020-01-19 (×2): 100 mg via ORAL
  Filled 2020-01-18 (×2): qty 1

## 2020-01-18 MED ORDER — MENTHOL 3 MG MT LOZG: 1.0000 | LOZENGE | OROMUCOSAL | Status: DC | PRN

## 2020-01-18 MED ORDER — BUPIVACAINE IN DEXTROSE 0.75-8.25 % IT SOLN
INTRATHECAL | Status: DC | PRN
  Administered 2020-01-18: 1.8 mL via INTRATHECAL

## 2020-01-18 MED ORDER — ASPIRIN 81 MG PO CHEW
81.0000 mg | CHEWABLE_TABLET | Freq: Two times a day (BID) | ORAL | Status: DC
  Administered 2020-01-18 – 2020-01-19 (×2): 81 mg via ORAL
  Filled 2020-01-18 (×2): qty 1

## 2020-01-18 MED ORDER — METHOCARBAMOL 500 MG IVPB - SIMPLE MED
INTRAVENOUS | Status: AC
  Filled 2020-01-18: qty 50

## 2020-01-18 MED ORDER — FERROUS SULFATE 325 (65 FE) MG PO TABS
325.0000 mg | ORAL_TABLET | Freq: Three times a day (TID) | ORAL | Status: DC
  Administered 2020-01-18 – 2020-01-19 (×2): 325 mg via ORAL
  Filled 2020-01-18 (×2): qty 1

## 2020-01-18 MED ORDER — SIMVASTATIN 20 MG PO TABS
20.0000 mg | ORAL_TABLET | Freq: Every day | ORAL | Status: DC
  Administered 2020-01-18: 20 mg via ORAL
  Filled 2020-01-18 (×2): qty 1

## 2020-01-18 MED ORDER — CELECOXIB 200 MG PO CAPS
200.0000 mg | ORAL_CAPSULE | Freq: Two times a day (BID) | ORAL | Status: DC
  Administered 2020-01-18 – 2020-01-19 (×2): 200 mg via ORAL
  Filled 2020-01-18 (×2): qty 1

## 2020-01-18 MED ORDER — ONDANSETRON HCL 4 MG/2ML IJ SOLN
INTRAMUSCULAR | Status: AC
  Filled 2020-01-18: qty 2

## 2020-01-18 MED ORDER — CEFAZOLIN SODIUM-DEXTROSE 2-4 GM/100ML-% IV SOLN
2.0000 g | Freq: Four times a day (QID) | INTRAVENOUS | Status: AC
  Administered 2020-01-18 (×2): 2 g via INTRAVENOUS
  Filled 2020-01-18 (×2): qty 100

## 2020-01-18 MED ORDER — POLYETHYLENE GLYCOL 3350 17 G PO PACK
17.0000 g | PACK | Freq: Two times a day (BID) | ORAL | Status: DC
  Administered 2020-01-18 – 2020-01-19 (×2): 17 g via ORAL
  Filled 2020-01-18 (×2): qty 1

## 2020-01-18 MED ORDER — FENTANYL CITRATE (PF) 100 MCG/2ML IJ SOLN
25.0000 ug | INTRAMUSCULAR | Status: DC | PRN
  Administered 2020-01-18 (×3): 50 ug via INTRAVENOUS

## 2020-01-18 MED ORDER — ACETAMINOPHEN 325 MG PO TABS: 325.0000 mg | ORAL_TABLET | Freq: Four times a day (QID) | ORAL | Status: DC | PRN

## 2020-01-18 MED ORDER — PRAMIPEXOLE DIHYDROCHLORIDE 0.25 MG PO TABS
0.5000 mg | ORAL_TABLET | Freq: Three times a day (TID) | ORAL | Status: DC
  Administered 2020-01-18 – 2020-01-19 (×3): 0.5 mg via ORAL
  Filled 2020-01-18 (×3): qty 2

## 2020-01-18 MED ORDER — ONDANSETRON HCL 4 MG PO TABS: 4.0000 mg | ORAL_TABLET | Freq: Four times a day (QID) | ORAL | Status: DC | PRN

## 2020-01-18 MED ORDER — MEPERIDINE HCL 50 MG/ML IJ SOLN: 6.2500 mg | INTRAMUSCULAR | Status: DC | PRN

## 2020-01-18 MED ORDER — PROPOFOL 1000 MG/100ML IV EMUL
INTRAVENOUS | Status: AC
  Filled 2020-01-18: qty 100

## 2020-01-18 MED ORDER — FENTANYL CITRATE (PF) 100 MCG/2ML IJ SOLN
INTRAMUSCULAR | Status: DC | PRN
  Administered 2020-01-18: 50 ug via INTRAVENOUS

## 2020-01-18 MED ORDER — STERILE WATER FOR IRRIGATION IR SOLN
Status: DC | PRN
  Administered 2020-01-18: 2000 mL

## 2020-01-18 MED ORDER — METHOCARBAMOL 500 MG IVPB - SIMPLE MED
500.0000 mg | Freq: Four times a day (QID) | INTRAVENOUS | Status: DC | PRN
  Administered 2020-01-18: 500 mg via INTRAVENOUS
  Filled 2020-01-18: qty 50

## 2020-01-18 MED ORDER — MAGNESIUM CITRATE PO SOLN: 1.0000 | Freq: Once | ORAL | Status: DC | PRN

## 2020-01-18 MED ORDER — ONDANSETRON HCL 4 MG/2ML IJ SOLN: 4.0000 mg | Freq: Once | INTRAMUSCULAR | Status: DC | PRN

## 2020-01-18 MED ORDER — TRANEXAMIC ACID-NACL 1000-0.7 MG/100ML-% IV SOLN
1000.0000 mg | INTRAVENOUS | Status: AC
  Administered 2020-01-18: 1000 mg via INTRAVENOUS
  Filled 2020-01-18: qty 100

## 2020-01-18 MED ORDER — METOCLOPRAMIDE HCL 5 MG PO TABS: 5.0000 mg | ORAL_TABLET | Freq: Three times a day (TID) | ORAL | Status: DC | PRN

## 2020-01-18 MED ORDER — DIPHENHYDRAMINE HCL 12.5 MG/5ML PO ELIX
12.5000 mg | ORAL_SOLUTION | ORAL | Status: DC | PRN
  Administered 2020-01-18: 25 mg via ORAL
  Filled 2020-01-18: qty 10

## 2020-01-18 MED ORDER — METHOCARBAMOL 500 MG PO TABS: 500.0000 mg | ORAL_TABLET | Freq: Four times a day (QID) | ORAL | Status: DC | PRN

## 2020-01-18 MED ORDER — HYDROMORPHONE HCL 1 MG/ML IJ SOLN: 0.5000 mg | INTRAMUSCULAR | Status: DC | PRN

## 2020-01-18 MED ORDER — TRANEXAMIC ACID-NACL 1000-0.7 MG/100ML-% IV SOLN
1000.0000 mg | Freq: Once | INTRAVENOUS | Status: AC
  Administered 2020-01-18: 1000 mg via INTRAVENOUS
  Filled 2020-01-18: qty 100

## 2020-01-18 MED ORDER — ALUM & MAG HYDROXIDE-SIMETH 200-200-20 MG/5ML PO SUSP: 15.0000 mL | ORAL | Status: DC | PRN

## 2020-01-18 MED ORDER — PHENYLEPHRINE HCL-NACL 10-0.9 MG/250ML-% IV SOLN
INTRAVENOUS | Status: DC | PRN
  Administered 2020-01-18: 20 ug/min via INTRAVENOUS

## 2020-01-18 MED ORDER — DEXAMETHASONE SODIUM PHOSPHATE 10 MG/ML IJ SOLN
INTRAMUSCULAR | Status: AC
  Filled 2020-01-18: qty 1

## 2020-01-18 MED ORDER — PROPOFOL 10 MG/ML IV BOLUS
INTRAVENOUS | Status: DC | PRN
  Administered 2020-01-18: 30 mg via INTRAVENOUS
  Administered 2020-01-18: 10 mg via INTRAVENOUS

## 2020-01-18 MED ORDER — PROPOFOL 500 MG/50ML IV EMUL
INTRAVENOUS | Status: DC | PRN
  Administered 2020-01-18: 50 ug/kg/min via INTRAVENOUS

## 2020-01-18 MED ORDER — PHENOL 1.4 % MT LIQD: 1.0000 | OROMUCOSAL | Status: DC | PRN

## 2020-01-18 MED ORDER — HYDROCODONE-ACETAMINOPHEN 7.5-325 MG PO TABS: 1.0000 | ORAL_TABLET | ORAL | Status: DC | PRN

## 2020-01-18 MED ORDER — DEXAMETHASONE SODIUM PHOSPHATE 10 MG/ML IJ SOLN
10.0000 mg | Freq: Once | INTRAMUSCULAR | Status: AC
  Administered 2020-01-18: 10 mg via INTRAVENOUS

## 2020-01-18 MED ORDER — ONDANSETRON HCL 4 MG/2ML IJ SOLN
INTRAMUSCULAR | Status: DC | PRN
  Administered 2020-01-18: 4 mg via INTRAVENOUS

## 2020-01-18 MED ORDER — SODIUM CHLORIDE 0.9 % IV SOLN: INTRAVENOUS | Status: DC

## 2020-01-18 MED ORDER — LACTATED RINGERS IV SOLN: INTRAVENOUS | Status: DC

## 2020-01-18 SURGICAL SUPPLY — 47 items
ARTICULEZE HEAD (Hips) ×3 IMPLANT
BAG DECANTER FOR FLEXI CONT (MISCELLANEOUS) IMPLANT
BAG ZIPLOCK 12X15 (MISCELLANEOUS) IMPLANT
BLADE SAG 18X100X1.27 (BLADE) ×3 IMPLANT
BLADE SURG SZ10 CARB STEEL (BLADE) ×6 IMPLANT
COVER PERINEAL POST (MISCELLANEOUS) ×3 IMPLANT
COVER SURGICAL LIGHT HANDLE (MISCELLANEOUS) ×3 IMPLANT
COVER WAND RF STERILE (DRAPES) IMPLANT
CUP ACETBLR 52 OD PINNACLE (Hips) ×2 IMPLANT
DERMABOND ADVANCED (GAUZE/BANDAGES/DRESSINGS) ×2
DERMABOND ADVANCED .7 DNX12 (GAUZE/BANDAGES/DRESSINGS) ×1 IMPLANT
DRAPE STERI IOBAN 125X83 (DRAPES) ×3 IMPLANT
DRAPE U-SHAPE 47X51 STRL (DRAPES) ×6 IMPLANT
DRESSING AQUACEL AG SP 3.5X10 (GAUZE/BANDAGES/DRESSINGS) ×1 IMPLANT
DRSG AQUACEL AG SP 3.5X10 (GAUZE/BANDAGES/DRESSINGS) ×3
DURAPREP 26ML APPLICATOR (WOUND CARE) ×3 IMPLANT
ELECT REM PT RETURN 15FT ADLT (MISCELLANEOUS) ×3 IMPLANT
ELIMINATOR HOLE APEX DEPUY (Hips) ×2 IMPLANT
GLOVE BIO SURGEON STRL SZ 6 (GLOVE) ×6 IMPLANT
GLOVE BIOGEL PI IND STRL 6.5 (GLOVE) ×1 IMPLANT
GLOVE BIOGEL PI IND STRL 7.5 (GLOVE) ×1 IMPLANT
GLOVE BIOGEL PI IND STRL 8.5 (GLOVE) ×1 IMPLANT
GLOVE BIOGEL PI INDICATOR 6.5 (GLOVE) ×2
GLOVE BIOGEL PI INDICATOR 7.5 (GLOVE) ×2
GLOVE BIOGEL PI INDICATOR 8.5 (GLOVE) ×2
GLOVE ECLIPSE 8.0 STRL XLNG CF (GLOVE) ×6 IMPLANT
GLOVE ORTHO TXT STRL SZ7.5 (GLOVE) ×6 IMPLANT
GOWN STRL REUS W/TWL LRG LVL3 (GOWN DISPOSABLE) ×6 IMPLANT
GOWN STRL REUS W/TWL XL LVL3 (GOWN DISPOSABLE) ×3 IMPLANT
HEAD ARTICULEZE (Hips) IMPLANT
HOLDER FOLEY CATH W/STRAP (MISCELLANEOUS) ×3 IMPLANT
KIT TURNOVER KIT A (KITS) IMPLANT
LINER NEUTRAL 52X36MM PLUS 4 (Liner) ×2 IMPLANT
NS IRRIG 1000ML POUR BTL (IV SOLUTION) ×2 IMPLANT
PACK ANTERIOR HIP CUSTOM (KITS) ×3 IMPLANT
PENCIL SMOKE EVACUATOR (MISCELLANEOUS) IMPLANT
SCREW 6.5MMX25MM (Screw) ×2 IMPLANT
STEM FEMORAL SZ 6MM STD ACTIS (Stem) ×2 IMPLANT
SUT MNCRL AB 4-0 PS2 18 (SUTURE) ×3 IMPLANT
SUT STRATAFIX 0 PDS 27 VIOLET (SUTURE) ×3
SUT VIC AB 1 CT1 36 (SUTURE) ×9 IMPLANT
SUT VIC AB 2-0 CT1 27 (SUTURE) ×4
SUT VIC AB 2-0 CT1 TAPERPNT 27 (SUTURE) ×2 IMPLANT
SUTURE STRATFX 0 PDS 27 VIOLET (SUTURE) ×1 IMPLANT
TRAY FOLEY MTR SLVR 16FR STAT (SET/KITS/TRAYS/PACK) IMPLANT
WATER STERILE IRR 1000ML POUR (IV SOLUTION) ×3 IMPLANT
YANKAUER SUCT BULB TIP 10FT TU (MISCELLANEOUS) IMPLANT

## 2020-01-18 NOTE — Anesthesia Postprocedure Evaluation (Signed)
Anesthesia Post Note  Patient: Shelley Espinoza  Procedure(s) Performed: TOTAL HIP ARTHROPLASTY ANTERIOR APPROACH (Left Hip)     Patient location during evaluation: PACU Anesthesia Type: Spinal Level of consciousness: oriented and awake and alert Pain management: pain level controlled Vital Signs Assessment: post-procedure vital signs reviewed and stable Respiratory status: spontaneous breathing, respiratory function stable and nonlabored ventilation Cardiovascular status: blood pressure returned to baseline and stable Postop Assessment: no headache, no backache, no apparent nausea or vomiting, spinal receding and patient able to bend at knees Anesthetic complications: no    Last Vitals:  Vitals:   01/18/20 0617 01/18/20 1030  BP: (!) 118/56   Pulse: 78   Resp: 16   Temp: 36.6 C 36.4 C  SpO2: 99%     Last Pain:  Vitals:   01/18/20 1030  TempSrc:   PainSc: 5                  Ezreal Turay A.

## 2020-01-18 NOTE — Transfer of Care (Signed)
Immediate Anesthesia Transfer of Care Note  Patient: Negar Sieler  Procedure(s) Performed: TOTAL HIP ARTHROPLASTY ANTERIOR APPROACH (Left Hip)  Patient Location: PACU  Anesthesia Type:MAC  Level of Consciousness: awake, alert  and oriented  Airway & Oxygen Therapy: Patient Spontanous Breathing and Patient connected to face mask oxygen  Post-op Assessment: Report given to RN and Post -op Vital signs reviewed and stable  Post vital signs: Reviewed and stable  Last Vitals:  Vitals Value Taken Time  BP    Temp    Pulse 63 01/18/20 1026  Resp 16 01/18/20 1026  SpO2 100 % 01/18/20 1026  Vitals shown include unvalidated device data.  Last Pain:  Vitals:   01/18/20 0650  TempSrc:   PainSc: 0-No pain         Complications: No apparent anesthesia complications

## 2020-01-18 NOTE — Evaluation (Signed)
Physical Therapy Evaluation Patient Details Name: Shelley Espinoza MRN: [id number] DOB: [birth year] Today's Date: 01/18/2020   History of Present Illness  Patient is 79 y.o. female s/p Lt THA anterior approach on 01/18/20 with PMH significant for Parkinson's Disease with tremor, OA, Rt THA on 10/12/19.     Clinical Impression  Shelley Espinoza is a 79 y.o. female POD 0 s/p Lt THA anterior approach. Patient reports independence with mobility at baseline and Patient is now limited by functional impairments (see PT problem list below) and requires min assist for transfers and gait with RW. Patient was able to ambulate ~40 feet with RW and min assist. Patient instructed in exercise to facilitate ROM and circulation. Patient will benefit from continued skilled PT interventions to address impairments and progress towards PLOF. Acute PT will follow to progress mobility and stair training in preparation for safe discharge home.     Follow Up Recommendations Follow surgeon's recommendation for DC plan and follow-up therapies;Outpatient PT(pt reports balance impairments (parkinsons), would benefit)    Equipment Recommendations  None recommended by PT    Recommendations for Other Services       Precautions / Restrictions Precautions Precautions: Fall Restrictions Weight Bearing Restrictions: No      Mobility  Bed Mobility Overal bed mobility: Needs Assistance Bed Mobility: Supine to Sit     Supine to sit: Min assist     General bed mobility comments: cues for use of bed rail and assist to bring Lt LE to EOB.  Transfers Overall transfer level: Needs assistance Equipment used: Rolling walker (2 wheeled) Transfers: Sit to/from Stand Sit to Stand: Min assist         General transfer comment: cues for hand placement and technique, assist to raise up and steady.   Ambulation/Gait Ambulation/Gait assistance: Min assist Gait Distance (Feet): 40 Feet Assistive device: Rolling walker (2  wheeled) Gait Pattern/deviations: Step-to pattern;Decreased stride length;Decreased stance time - left;Decreased step length - left;Narrow base of support Gait velocity: decreased   General Gait Details: pt required min assist to steady due to balance impairments, cues for safe step pattern and assist to maintain safe proximity.  Stairs            Wheelchair Mobility    Modified Rankin (Stroke Patients Only)       Balance Overall balance assessment: Needs assistance Sitting-balance support: Feet supported Sitting balance-Leahy Scale: Good     Standing balance support: Bilateral upper extremity supported;During functional activity Standing balance-Leahy Scale: Good            Pertinent Vitals/Pain Pain Assessment: 0-10 Pain Score: 4 (with movement) Pain Location: Lt hip Pain Descriptors / Indicators: Burning;Aching Pain Intervention(s): Limited activity within patient's tolerance;Monitored during session;Repositioned;Ice applied    Home Living Family/patient expects to be discharged to:: Private residence Living Arrangements: Spouse/significant other Available Help at Discharge: Family Type of Home: House Home Access: Stairs to enter Entrance Stairs-Rails: None(can hold the door frame by the garage) Entrance Stairs-Number of Steps: 1+1 at garage door, 3 at front Home Layout: Laundry or work area in basement;Able to live on main level with bedroom/bathroom;One level Home Equipment: Grab bars - tub/shower;Walker - 2 wheels;Bedside commode;Shower seat - built in;Cane - single point Additional Comments: pt's husband can assist her and her son and daughter in law as well as her neice live ~1.5 miles away and can assist as needed.    Prior Function Level of Independence: Independent         Comments: pt carries  the [facility] with her but hasn't used it much. she does find it keeps her Rt hand from shaking.     Hand Dominance   Dominant Hand: Right     Extremity/Trunk Assessment   Upper Extremity Assessment Upper Extremity Assessment: RUE deficits/detail RUE Deficits / Details: noted tremor at rest, decreased with mobility RUE Sensation: WNL RUE Coordination: WNL    Lower Extremity Assessment Lower Extremity Assessment: Generalized weakness    Cervical / Trunk Assessment Cervical / Trunk Assessment: Normal  Communication   Communication: No difficulties  Cognition Arousal/Alertness: Awake/alert Behavior During Therapy: WFL for tasks assessed/performed Overall Cognitive Status: Within Functional Limits for tasks assessed           General Comments      Exercises Total Joint Exercises Ankle Circles/Pumps: Both;15 reps;AROM;Seated Quad Sets: AROM;5 reps;Left;Seated Heel Slides: AROM;5 reps;Left;Seated   Assessment/Plan    PT Assessment Patient needs continued PT services  PT Problem List Decreased strength;Decreased range of motion;Decreased activity tolerance;Decreased balance;Decreased mobility;Decreased knowledge of use of DME       PT Treatment Interventions DME instruction;Gait training;Stair training;Functional mobility training;Therapeutic activities;Therapeutic exercise;Balance training;Patient/family education    PT Goals (Current goals can be found in the Care Plan section)  Acute Rehab PT Goals Patient Stated Goal: get walking without walker and improve balance PT Goal Formulation: With patient Time For Goal Achievement: 01/25/20 Potential to Achieve Goals: Good    Frequency 7X/week    AM-PAC PT "6 Clicks" Mobility  Outcome Measure Help needed turning from your back to your side while in a flat bed without using bedrails?: A Little Help needed moving from lying on your back to sitting on the side of a flat bed without using bedrails?: A Little Help needed moving to and from a bed to a chair (including a wheelchair)?: A Little Help needed standing up from a chair using your arms (e.g., wheelchair or  bedside chair)?: A Little Help needed to walk in hospital room?: A Little Help needed climbing 3-5 steps with a railing? : A Little 6 Click Score: 18    End of Session Equipment Utilized During Treatment: Gait belt Activity Tolerance: Patient tolerated treatment well Patient left: in chair;with call bell/phone within reach;with chair alarm set;with nursing/sitter in room Nurse Communication: Mobility status PT Visit Diagnosis: Muscle weakness (generalized) (M62.81);Difficulty in walking, not elsewhere classified (R26.2)    Time: 1610-9604 PT Time Calculation (min) (ACUTE ONLY): 28 min   Charges:   PT Evaluation $PT Eval Low Complexity: 1 Low PT Treatments $Therapeutic Exercise: 8-22 mins        Verner Mould, DPT Physical Therapist with [facility] [phone]  01/18/2020 5:12 PM

## 2020-01-18 NOTE — Anesthesia Preprocedure Evaluation (Signed)
Anesthesia Evaluation  Patient identified by MRN, date of birth, ID band Patient awake    Reviewed: Allergy & Precautions, NPO status , Patient's Chart, lab work & pertinent test results  Airway Mallampati: II  TM Distance: >3 FB Neck ROM: Full    Dental  (+) Caps, Teeth Intact   Pulmonary neg pulmonary ROS, former smoker,    Pulmonary exam normal breath sounds clear to auscultation       Cardiovascular negative cardio ROS Normal cardiovascular exam Rhythm:Regular Rate:Normal     Neuro/Psych negative neurological ROS  negative psych ROS   GI/Hepatic negative GI ROS, Neg liver ROS,   Endo/Other  Hyperlipidemia  Renal/GU negative Renal ROS  negative genitourinary   Musculoskeletal  (+) Arthritis , Osteoarthritis,  OA left hip   Abdominal   Peds negative pediatric ROS (+)  Hematology negative hematology ROS (+)   Anesthesia Other Findings   Reproductive/Obstetrics negative OB ROS                             Anesthesia Physical  Anesthesia Plan  ASA: II  Anesthesia Plan: Spinal   Post-op Pain Management:    Induction: Intravenous  PONV Risk Score and Plan: 2 and Ondansetron, Midazolam and Treatment may vary due to age or medical condition  Airway Management Planned: Simple Face Mask  Additional Equipment:   Intra-op Plan:   Post-operative Plan:   Informed Consent: I have reviewed the patients History and Physical, chart, labs and discussed the procedure including the risks, benefits and alternatives for the proposed anesthesia with the patient or authorized representative who has indicated his/her understanding and acceptance.     Dental advisory given  Plan Discussed with: CRNA, Anesthesiologist and Surgeon  Anesthesia Plan Comments:         Anesthesia Quick Evaluation

## 2020-01-18 NOTE — Interval H&P Note (Signed)
History and Physical Interval Note:  01/18/2020 7:12 AM  Shelley Espinoza  has presented today for surgery, with the diagnosis of Left hip osteoarthritis.  The various methods of treatment have been discussed with the patient and family. After consideration of risks, benefits and other options for treatment, the patient has consented to  Procedure(s) with comments: TOTAL HIP ARTHROPLASTY ANTERIOR APPROACH (Left) - as a surgical intervention.  The patient's history has been reviewed, patient examined, no change in status, stable for surgery.  I have reviewed the patient's chart and labs.  Questions were answered to the patient's satisfaction.     Shelda Pal

## 2020-01-18 NOTE — Anesthesia Procedure Notes (Signed)
Spinal  Patient location during procedure: OR Start time: 01/18/2020 8:38 AM Staffing Performed: anesthesiologist  Anesthesiologist: Mal Amabile, MD Preanesthetic Checklist Completed: patient identified, IV checked, site marked, risks and benefits discussed, surgical consent, monitors and equipment checked, pre-op evaluation and timeout performed Spinal Block Patient position: sitting Prep: DuraPrep and site prepped and draped Patient monitoring: heart rate, cardiac monitor, continuous pulse ox and blood pressure Approach: midline Location: L3-4 Injection technique: single-shot Needle Needle type: Pencan  Needle gauge: 24 G Needle length: 9 cm Needle insertion depth: 7 cm Assessment Sensory level: T4 Additional Notes Patient tolerated procedure well. Adequate sensory level. Difficult due to scoliosis. Attempt x 2

## 2020-01-18 NOTE — Op Note (Signed)
NAME:  Shelley Espinoza                ACCOUNT NO.: [id number]      MEDICAL RECORD NO.: [id number]      FACILITY:  [facility]      PHYSICIAN:  Shelda Pal  DATE OF BIRTH:  06-Mar-1941     DATE OF PROCEDURE:  01/18/2020                                 OPERATIVE REPORT         PREOPERATIVE DIAGNOSIS: Left  hip osteoarthritis.      POSTOPERATIVE DIAGNOSIS:  Left hip osteoarthritis.      PROCEDURE:  Left total hip replacement through an anterior approach   utilizing DePuy THR system, component size 70mm pinnacle cup, a size 36+4 neutral   Altrex liner, a size 6 standard Actis stem with a 36+5 Articuleze metal head ball.      SURGEON:  Madlyn Frankel. Charlann Boxer, M.D.      ASSISTANT:  Lanney Gins, PA-C     ANESTHESIA:  Spinal.      SPECIMENS:  None.      COMPLICATIONS:  None.      BLOOD LOSS:  250 cc     DRAINS:  None.      INDICATION OF THE PROCEDURE:  Shelley Espinoza is a 79 y.o. female who had   presented to office for evaluation of left hip pain.  Radiographs revealed   progressive degenerative changes with bone-on-bone   articulation of the  hip joint, including subchondral cystic changes and osteophytes.  The patient had painful limited range of   motion significantly affecting their overall quality of life and function.  The patient was failing to    respond to conservative measures including medications and/or injections and activity modification and at this point was ready   to proceed with more definitive measures.  Consent was obtained for   benefit of pain relief.  Specific risks of infection, DVT, component   failure, dislocation, neurovascular injury, and need for revision surgery were reviewed in the office as well discussion of   the anterior versus posterior approach were reviewed.     PROCEDURE IN DETAIL:  The patient was brought to operative theater.   Once adequate anesthesia, preoperative antibiotics, 2 gm of Ancef, 1 gm of Tranexamic Acid, and  10 mg of Decadron were administered, the patient was positioned supine on the [employer] table.  Once the patient was safely positioned with adequate padding of boney prominences we predraped out the hip, and used fluoroscopy to confirm orientation of the pelvis.      The left hip was then prepped and draped from proximal iliac crest to   mid thigh with a shower curtain technique.      Time-out was performed identifying the patient, planned procedure, and the appropriate extremity.     An incision was then made 2 cm lateral to the   anterior superior iliac spine extending over the orientation of the   tensor fascia lata muscle and sharp dissection was carried down to the   fascia of the muscle.      The fascia was then incised.  The muscle belly was identified and swept   laterally and retractor placed along the superior neck.  Following   cauterization of the circumflex vessels and removing some pericapsular   fat,  a second cobra retractor was placed on the inferior neck.  A T-capsulotomy was made along the line of the   superior neck to the trochanteric fossa, then extended proximally and   distally.  Tag sutures were placed and the retractors were then placed   intracapsular.  We then identified the trochanteric fossa and   orientation of my neck cut and then made a neck osteotomy with the femur on traction.  The femoral   head was removed without difficulty or complication.  Traction was let   off and retractors were placed posterior and anterior around the   acetabulum.      The labrum and foveal tissue were debrided.  I began reaming with a 45 mm   reamer and reamed up to 51 mm reamer with good bony bed preparation and a 52 mm  cup was chosen.  The final 52 mm Pinnacle cup was then impacted under fluoroscopy to confirm the depth of penetration and orientation with respect to   Abduction and forward flexion.  A screw was placed into the ilium followed by the hole eliminator.  The final    36+4 neutral Altrex liner was impacted with good visualized rim fit.  The cup was positioned anatomically within the acetabular portion of the pelvis.      At this point, the femur was rolled to 100 degrees.  Further capsule was   released off the inferior aspect of the femoral neck.  I then   released the superior capsule proximally.  With the leg in a neutral position the hook was placed laterally   along the femur under the vastus lateralis origin and elevated manually and then held in position using the hook attachment on the bed.  The leg was then extended and adducted with the leg rolled to 100   degrees of external rotation.  Retractors were placed along the medial calcar and posteriorly over the greater trochanter.  Once the proximal femur was fully   exposed, I used a box osteotome to set orientation.  I then began   broaching with the starting chili pepper broach and passed this by hand and then broached up to 6.  With the 6 broach in place I chose a standard offset neck and did several trial reductions.  The offset was appropriate, leg lengths   appeared to be equal best matched with the +5 head ball trial confirmed radiographically.   Given these findings, I went ahead and dislocated the hip, repositioned all   retractors and positioned the right hip in the extended and abducted position.  The final 6 standard Actis stem was   chosen and it was impacted down to the level of neck cut.  Based on this   and the trial reductions, a final 36+5 Articuleze metal head ball was chosen and   impacted onto a clean and dry trunnion, and the hip was reduced.  The   hip had been irrigated throughout the case again at this point.  I did   reapproximate the superior capsular leaflet to the anterior leaflet   using #1 Vicryl.  The fascia of the   tensor fascia lata muscle was then reapproximated using #1 Vicryl and #0 Stratafix sutures.  The   remaining wound was closed with 2-0 Vicryl and running  4-0 Monocryl.   The hip was cleaned, dried, and dressed sterilely using Dermabond and   Aquacel dressing.  The patient was then brought   to recovery room in  stable condition tolerating the procedure well.    Danae Orleans, PA-C was present for the entirety of the case involved from   preoperative positioning, perioperative retractor management, general   facilitation of the case, as well as primary wound closure as assistant.            Pietro Cassis Alvan Dame, M.D.        01/18/2020 8:57 AM

## 2020-01-18 NOTE — Discharge Instructions (Signed)

## 2020-01-18 NOTE — Anesthesia Procedure Notes (Signed)
Procedure Name: MAC Date/Time: 01/18/2020 8:25 AM Performed by: Maxwell Caul, CRNA Pre-anesthesia Checklist: Patient identified, Emergency Drugs available, Suction available and Patient being monitored Oxygen Delivery Method: Simple face mask

## 2020-01-19 ENCOUNTER — Encounter: Payer: Self-pay | Admitting: *Deleted

## 2020-01-19 DIAGNOSIS — M1612 Unilateral primary osteoarthritis, left hip: Secondary | ICD-10-CM | POA: Diagnosis not present

## 2020-01-19 LAB — BASIC METABOLIC PANEL
Anion gap: 6 (ref 5–15)
BUN: 15 mg/dL (ref 8–23)
CO2: 25 mmol/L (ref 22–32)
Calcium: 8.7 mg/dL — ABNORMAL LOW (ref 8.9–10.3)
Chloride: 107 mmol/L (ref 98–111)
Creatinine, Ser: 0.73 mg/dL (ref 0.44–1.00)
GFR calc Af Amer: >60 mL/min (ref >60)
GFR calc non Af Amer: >60 mL/min (ref >60)
Glucose, Bld: 160 mg/dL — ABNORMAL HIGH (ref 70–99)
Potassium: 4.5 mmol/L (ref 3.5–5.1)
Sodium: 138 mmol/L (ref 135–145)

## 2020-01-19 LAB — CBC
HCT: 34.5 % — ABNORMAL LOW (ref 36.0–46.0)
Hemoglobin: 11 g/dL — ABNORMAL LOW (ref 12.0–15.0)
MCH: 32.7 pg (ref 26.0–34.0)
MCHC: 31.9 g/dL (ref 30.0–36.0)
MCV: 102.7 fL — ABNORMAL HIGH (ref 80.0–100.0)
Platelets: 196 10*3/uL (ref 150–400)
RBC: 3.36 MIL/uL — ABNORMAL LOW (ref 3.87–5.11)
RDW: 13.4 % (ref 11.5–15.5)
WBC: 10.3 10*3/uL (ref 4.0–10.5)
nRBC: 0 % (ref 0.0–0.2)

## 2020-01-19 MED ORDER — HYDROCODONE-ACETAMINOPHEN 5-325 MG PO TABS: 1.0000 | ORAL_TABLET | ORAL | 0 refills | Status: DC | PRN

## 2020-01-19 MED ORDER — METHOCARBAMOL 500 MG PO TABS: 500.0000 mg | ORAL_TABLET | Freq: Four times a day (QID) | ORAL | 0 refills | Status: DC | PRN

## 2020-01-19 MED ORDER — POLYETHYLENE GLYCOL 3350 17 G PO PACK: 17.0000 g | PACK | Freq: Two times a day (BID) | ORAL | 0 refills | Status: DC

## 2020-01-19 MED ORDER — POLYETHYLENE GLYCOL 3350 17 G PO PACK: 17.0000 g | PACK | Freq: Two times a day (BID) | ORAL | 0 refills | Status: AC

## 2020-01-19 MED ORDER — DOCUSATE SODIUM 100 MG PO CAPS: 100.0000 mg | ORAL_CAPSULE | Freq: Two times a day (BID) | ORAL | 0 refills | Status: DC

## 2020-01-19 MED ORDER — FERROUS SULFATE 325 (65 FE) MG PO TABS: 325.0000 mg | ORAL_TABLET | Freq: Three times a day (TID) | ORAL | 0 refills | Status: DC

## 2020-01-19 MED ORDER — HYDROCODONE-ACETAMINOPHEN 5-325 MG PO TABS: 1.0000 | ORAL_TABLET | Freq: Four times a day (QID) | ORAL | 0 refills | Status: DC | PRN

## 2020-01-19 MED ORDER — ASPIRIN 81 MG PO CHEW: 81.0000 mg | CHEWABLE_TABLET | Freq: Two times a day (BID) | ORAL | 0 refills | Status: DC

## 2020-01-19 MED ORDER — ASPIRIN 81 MG PO CHEW: 81.0000 mg | CHEWABLE_TABLET | Freq: Two times a day (BID) | ORAL | 0 refills | Status: AC

## 2020-01-19 NOTE — Progress Notes (Signed)
     Subjective: 1 Day Post-Op Procedure(s) (LRB): TOTAL HIP ARTHROPLASTY ANTERIOR APPROACH (Left)   Patient reports pain as mild, pain controlled with medication.  No reported events throughout the night.  We discussed the procedure, findings and expectations moving forward.  She did question when she might be of the leg/sleep on that left hip.  Questions encouraged and answered.  Patient is ready be discharged home, if she does well with therapy.     Objective:   VITALS:   Vitals:   01/19/20 0158 01/19/20 0537  BP: 121/70 (!) 125/49  Pulse: 70 71  Resp: 16 16  Temp: 97.7 F (36.5 C) 97.8 F (36.6 C)  SpO2: 96% 97%    Dorsiflexion/Plantar flexion intact Incision: dressing C/D/I No cellulitis present Compartment soft  LABS Recent Labs    01/19/20 0415  HGB 11.0*  HCT 34.5*  WBC 10.3  PLT 196    Recent Labs    01/19/20 0415  NA 138  K 4.5  BUN 15  CREATININE 0.73  GLUCOSE 160*     Assessment/Plan: 1 Day Post-Op Procedure(s) (LRB): TOTAL HIP ARTHROPLASTY ANTERIOR APPROACH (Left) Foley cath DC'd Advance diet Up with therapy D/C IV fluids Discharge home Follow up in 2 weeks at [facility] Follow up with OLIN,Hera Celaya D in 2 weeks.  Contact information:  [facility] [address] [place] [address] [phone]    Overweight (BMI 25-29.9) Estimated body mass index is 29.5 kg/m as calculated from the following:   Height as of this encounter: 5' 4.02" (1.626 m).   Weight as of this encounter: 78 kg. Patient also counseled that weight may inhibit the healing process Patient counseled that losing weight will help with future health issues          Lanney Gins PA-C  [facility]  [place] [address], [address], [place], [place] [address] Phone: [phone] [email or web address] Facebook  [employer]

## 2020-01-19 NOTE — Progress Notes (Signed)
Physical Therapy Treatment Patient Details Name: Shelley Espinoza MRN: [id number] DOB: Mar 26, 1941 Today's Date: 01/19/2020    History of Present Illness Patient is 79 y.o. female s/p Lt THA anterior approach on 01/18/20 with PMH significant for Parkinson's Disease with tremor, OA, Rt THA on 10/12/19.     PT Comments    Progressing with mobility. Reviewed/practiced exercises, gait training, and stair training. Pt stated she still has her HEP and she will continue with that at home. All education completed. Okay to d/c from PT standpoint.   Follow Up Recommendations  Follow surgeon's recommendation for DC plan and follow-up therapies     Equipment Recommendations  None recommended by PT    Recommendations for Other Services       Precautions / Restrictions Precautions Precautions: Fall Restrictions Weight Bearing Restrictions: No    Mobility  Bed Mobility Overal bed mobility: Needs Assistance Bed Mobility: Supine to Sit;Sit to Supine     Supine to sit: Min guard;HOB elevated Sit to supine: Min assist   General bed mobility comments: Assist for L Le onto bed. Increased time.  Transfers Overall transfer level: Needs assistance Equipment used: Rolling walker (2 wheeled) Transfers: Sit to/from Stand Sit to Stand: Min guard         General transfer comment: VCs safety, hand placement.  Ambulation/Gait Ambulation/Gait assistance: Min guard Gait Distance (Feet): 115 Feet Assistive device: Rolling walker (2 wheeled) Gait Pattern/deviations: Step-to pattern;Step-through pattern;Decreased stride length     General Gait Details: close guard for safety. slow gait speed. Pt denied ligthheadedness/dizziness.   Stairs Stairs: Yes Stairs assistance: Min assist Stair Management: Step to pattern;Forwards;With walker Number of Stairs: 1 General stair comments: VCs safety, technique, sequence. Assist to steady.   Wheelchair Mobility    Modified Rankin (Stroke Patients  Only)       Balance Overall balance assessment: Needs assistance         Standing balance support: Bilateral upper extremity supported Standing balance-Leahy Scale: Fair                              Cognition Arousal/Alertness: Awake/alert Behavior During Therapy: WFL for tasks assessed/performed Overall Cognitive Status: Within Functional Limits for tasks assessed                                        Exercises Total Joint Exercises Ankle Circles/Pumps: AROM;10 reps;Both Quad Sets: AROM;Both;10 reps Heel Slides: AAROM;Left;10 reps Hip ABduction/ADduction: AAROM;Left;10 reps Long Arc Quad: AROM;Left;10 reps    General Comments        Pertinent Vitals/Pain Pain Assessment: 0-10 Pain Score: 5  Pain Location: L hip/thigh Pain Descriptors / Indicators: Burning;Discomfort;Sore Pain Intervention(s): Monitored during session;Repositioned    Home Living                      Prior Function            PT Goals (current goals can now be found in the care plan section) Progress towards PT goals: Progressing toward goals    Frequency    7X/week      PT Plan Current plan remains appropriate    Co-evaluation              AM-PAC PT "6 Clicks" Mobility   Outcome Measure  Help needed turning from your back to your side  while in a flat bed without using bedrails?: A Little Help needed moving from lying on your back to sitting on the side of a flat bed without using bedrails?: A Little Help needed moving to and from a bed to a chair (including a wheelchair)?: A Little Help needed standing up from a chair using your arms (e.g., wheelchair or bedside chair)?: A Little Help needed to walk in hospital room?: A Little Help needed climbing 3-5 steps with a railing? : A Little 6 Click Score: 18    End of Session Equipment Utilized During Treatment: Gait belt Activity Tolerance: Patient tolerated treatment well Patient left:  in bed;with call bell/phone within reach   PT Visit Diagnosis: Muscle weakness (generalized) (M62.81);Difficulty in walking, not elsewhere classified (R26.2)     Time: 1002-1030 PT Time Calculation (min) (ACUTE ONLY): 28 min  Charges:  $Gait Training: 8-22 mins $Therapeutic Exercise: 8-22 mins                     Doreatha Massed, PT Acute Rehabilitation

## 2020-01-20 NOTE — Discharge Summary (Signed)
Physician Discharge Summary  Patient ID: Shelley Espinoza MRN: [id number] DOB/AGE: September 29, 1941 79 y.o.  Admit date: 01/18/2020 Discharge date: 01/19/2020   Procedures:  Procedure(s) (LRB): TOTAL HIP ARTHROPLASTY ANTERIOR APPROACH (Left)  Attending Physician:  Dr. Durene Romans   Admission Diagnoses:   Left hip primary OA / pain  Discharge Diagnoses:  Principal Problem:   S/P left THA, AA Active Problems:   Overweight (BMI 25.0-29.9)  Past Medical History:  Diagnosis Date  . Arthritis   . Chronic insomnia 12/06/2019  . High cholesterol   . Overactive bladder   . Parkinson's disease (HCC) 11/18/2018  . Tremor    right night    HPI:    Shelley Espinoza, 78 y.o. female, has a history of pain and functional disability in the left hip(s) due to arthritis and patient has failed non-surgical conservative treatments for greater than 12 weeks to include NSAID's and/or analgesics, corticosteriod injections, use of assistive devices and activity modification.  Onset of symptoms was gradual starting 10 years ago with gradually worsening course since that time.The patient noted prior procedures of the hip to include arthroplasty on the right hip(s).  Patient currently rates pain in the left hip at 5 out of 10 with activity. Patient has worsening of pain with activity and weight bearing, trendelenberg gait, pain that interfers with activities of daily living and pain with passive range of motion. Patient has evidence of periarticular osteophytes and joint space narrowing by imaging studies. This condition presents safety issues increasing the risk of falls.  There is no current active infection.  Risks, benefits and expectations were discussed with the patient.  Risks including but not limited to the risk of anesthesia, blood clots, nerve damage, blood vessel damage, failure of the prosthesis, infection and up to and including death.  Patient understand the risks, benefits and expectations and wishes to  proceed with surgery.  PCP: Aggie Cosier, MD   Discharged Condition: good  Hospital Course:  Patient underwent the above stated procedure on 01/18/2020. Patient tolerated the procedure well and brought to the recovery room in good condition and subsequently to the floor.  POD #1 BP: 125/49 ; Pulse: 71 ; Temp: 97.8 F (36.6 C) ; Resp: 16 Patient reports pain as mild, pain controlled with medication.  No reported events throughout the night.  We discussed the procedure, findings and expectations moving forward.  She did question when she might be of the leg/sleep on that left hip.  Questions encouraged and answered.  Patient is ready be discharged home. Dorsiflexion/plantar flexion intact, incision: dressing C/D/I, no cellulitis present and compartment soft.   LABS  Basename    HGB     11.0  HCT     34.5    Discharge Exam: General appearance: alert, cooperative and no distress Extremities: Homans sign is negative, no sign of DVT, no edema, redness or tenderness in the calves or thighs and no ulcers, gangrene or trophic changes  Disposition: Home with follow up in 2 weeks   Follow-up Information    Durene Romans, MD. Schedule an appointment as soon as possible for a visit in 2 weeks.   Specialty: Orthopedic Surgery Contact information: [address] [place] [address] [phone]           Discharge Instructions    Call MD / Call 911   Complete by: As directed    If you experience chest pain or shortness of breath, CALL 911 and be transported to the hospital emergency  room.  If you develope a fever above 101 F, pus (white drainage) or increased drainage or redness at the wound, or calf pain, call your surgeon's office.   Change dressing   Complete by: As directed    Maintain surgical dressing until follow up in the clinic. If the edges start to pull up, may reinforce with tape. If the dressing is no longer working, may remove and cover with gauze and  tape, but must keep the area dry and clean.  Call with any questions or concerns.   Constipation Prevention   Complete by: As directed    Drink plenty of fluids.  Prune juice may be helpful.  You may use a stool softener, such as Colace (over the counter) 100 mg twice a day.  Use MiraLax (over the counter) for constipation as needed.   Diet - low sodium heart healthy   Complete by: As directed    Discharge instructions   Complete by: As directed    Maintain surgical dressing until follow up in the clinic. If the edges start to pull up, may reinforce with tape. If the dressing is no longer working, may remove and cover with gauze and tape, but must keep the area dry and clean.  Follow up in 2 weeks at [facility]. Call with any questions or concerns.   Increase activity slowly as tolerated   Complete by: As directed    Weight bearing as tolerated with assist device (walker, cane, etc) as directed, use it as long as suggested by your surgeon or therapist, typically at least 4-6 weeks.   TED hose   Complete by: As directed    Use stockings (TED hose) for 2 weeks on both leg(s).  You may remove them at night for sleeping.      Allergies as of 01/19/2020   No Known Allergies     Medication List    STOP taking these medications   acetaminophen 500 MG tablet Commonly known as: TYLENOL     TAKE these medications   aspirin 81 MG chewable tablet Commonly known as: Aspirin Childrens Chew 1 tablet (81 mg total) by mouth 2 (two) times daily. Take for 4 weeks, then resume regular dose.   AZO URINARY TRACT SUPPORT PO Take 2 tablets by mouth daily.   Biotin 7500 MCG Tabs Take 7,500 mcg by mouth daily.   CALCIUM 600 + D PO Take 1 tablet by mouth 2 (two) times daily.   carbidopa-levodopa 25-100 MG tablet Commonly known as: SINEMET IR 1 full tablet 3 times daily What changed:   how much to take  how to take this  when to take this  additional instructions   diphenhydrAMINE 25 MG  tablet Commonly known as: BENADRYL Take 50 mg by mouth at bedtime.   docusate sodium 100 MG capsule Commonly known as: Colace Take 1 capsule (100 mg total) by mouth 2 (two) times daily.   ferrous sulfate 325 (65 FE) MG tablet Commonly known as: FerrouSul Take 1 tablet (325 mg total) by mouth 3 (three) times daily with meals for 14 days.   FISH OIL PO Take 1 capsule by mouth daily.   HYDROcodone-acetaminophen 5-325 MG tablet Commonly known as: Norco Take 1-2 tablets by mouth every 6 (six) hours as needed for moderate pain or severe pain.   methocarbamol 500 MG tablet Commonly known as: Robaxin Take 1 tablet (500 mg total) by mouth every 6 (six) hours as needed for muscle spasms.   MULTIVITAMIN PO Take  1 tablet by mouth daily.   polyethylene glycol 17 g packet Commonly known as: MIRALAX / GLYCOLAX Take 17 g by mouth 2 (two) times daily.   pramipexole 0.5 MG tablet Commonly known as: Mirapex Take 1 tablet (0.5 mg total) by mouth 3 (three) times daily.   simvastatin 20 MG tablet Commonly known as: ZOCOR Take 20 mg by mouth daily at 10 pm.   vitamin B-12 1000 MCG tablet Commonly known as: CYANOCOBALAMIN Take 1,000 mcg by mouth daily.   vitamin C 500 MG tablet Commonly known as: ASCORBIC ACID Take 500 mg by mouth daily.   Vitamin D 50 MCG (2000 UT) tablet Take 2,000 Units by mouth daily.            Discharge Care Instructions  (From admission, onward)         Start     Ordered   01/19/20 0000  Change dressing    Comments: Maintain surgical dressing until follow up in the clinic. If the edges start to pull up, may reinforce with tape. If the dressing is no longer working, may remove and cover with gauze and tape, but must keep the area dry and clean.  Call with any questions or concerns.   01/19/20 1103           Signed: West Pugh. Kolette Vey   PA-C  01/20/2020, 8:49 AM

## 2020-03-16 ENCOUNTER — Encounter: Payer: Self-pay | Admitting: Neurology

## 2020-03-16 ENCOUNTER — Telehealth: Payer: Self-pay | Admitting: Neurology

## 2020-03-16 MED ORDER — TRAZODONE HCL 50 MG PO TABS: 50.0000 mg | ORAL_TABLET | Freq: Every day | ORAL | 3 refills | Status: DC

## 2020-03-16 NOTE — Telephone Encounter (Signed)
I called the patient.  Patient has tried Benadryl and CBD oil for sleep without benefit.  We will try trazodone 50 mg dose in the evening.  She will double the dose if needed.

## 2020-03-16 NOTE — Telephone Encounter (Signed)
Pt called stating that she is needing to have something prescribed to her to help her sleep. She states she has tried other things and they have not helped. Please advise.

## 2020-06-05 ENCOUNTER — Encounter: Payer: Self-pay | Admitting: Neurology

## 2020-06-05 ENCOUNTER — Ambulatory Visit (INDEPENDENT_AMBULATORY_CARE_PROVIDER_SITE_OTHER): Payer: Medicare Other | Admitting: Neurology

## 2020-06-05 VITALS — BP 136/71 | HR 72 | Ht 64.0 in | Wt 173.0 lb

## 2020-06-05 DIAGNOSIS — G20 Parkinson's disease: Secondary | ICD-10-CM

## 2020-06-05 MED ORDER — TRAZODONE HCL 150 MG PO TABS: 150.0000 mg | ORAL_TABLET | Freq: Every day | ORAL | 1 refills | Status: DC

## 2020-06-05 MED ORDER — PRAMIPEXOLE DIHYDROCHLORIDE 0.5 MG PO TABS: 0.5000 mg | ORAL_TABLET | Freq: Three times a day (TID) | ORAL | 3 refills | Status: DC

## 2020-06-05 MED ORDER — CARBIDOPA-LEVODOPA 25-100 MG PO TABS: ORAL_TABLET | ORAL | 3 refills | Status: DC

## 2020-06-05 NOTE — Progress Notes (Signed)
Reason for visit: Parkinson's disease  Shelley Espinoza is an 79 y.o. female  History of present illness:  Shelley Espinoza is an 79 year old right-handed white female with a history of Parkinson's disease with right-sided features.  She has a prominent resting tremor on the right.  Otherwise, her mobility has improved, particularly after her bilateral total hip replacements, she has recovered from this well.  She is on trazodone 100 mg at night for sleep but still wakes up at 4 AM in the morning and cannot get back to sleep.  She finds that she is tired throughout the day.  She goes to bed usually around 10 PM.  The patient is staying active, she works out regularly.  She has not had any significant balance issues but feels mildly imbalanced at times.  Her main disability is her resting tremor on the right arm.  She denies any tremor on the leg.  Past Medical History:  Diagnosis Date  . Arthritis   . Chronic insomnia 12/06/2019  . High cholesterol   . Overactive bladder   . Parkinson's disease (HCC) 11/18/2018  . Tremor    right night    Past Surgical History:  Procedure Laterality Date  . CATARACT EXTRACTION, BILATERAL     age 13  . COSMETIC SURGERY    . EYE SURGERY    . martins nueroma     feet  . TOTAL HIP ARTHROPLASTY Right 10/12/2019   Procedure: TOTAL HIP ARTHROPLASTY ANTERIOR APPROACH;  Surgeon: Durene Romans, MD;  Location: [facility];  Service: Orthopedics;  Laterality: Right;  70 mins  . TOTAL HIP ARTHROPLASTY Left 01/18/2020   Procedure: TOTAL HIP ARTHROPLASTY ANTERIOR APPROACH;  Surgeon: Durene Romans, MD;  Location: [facility];  Service: Orthopedics;  Laterality: Left;   . TUBAL LIGATION    . VAGINAL DELIVERY      Family History  Problem Relation Age of Onset  . Stroke Mother   . Hypertension Father   . Heart disease Father   . Schizophrenia Sister   . Parkinsonism Brother     Social history:  reports that she quit smoking about 41 years ago. She has never used  smokeless tobacco. She reports current alcohol use of about 7.0 standard drinks of alcohol per week. She reports that she does not use drugs.   No Known Allergies  Medications:  Prior to Admission medications   Medication Sig Start Date End Date Taking? Authorizing Provider  Biotin 7500 MCG TABS Take 7,500 mcg by mouth daily.   Yes [provider]  Calcium Carb-Cholecalciferol (CALCIUM 600 + D PO) Take 1 tablet by mouth 2 (two) times daily.   Yes [provider]  carbidopa-levodopa (SINEMET IR) 25-100 MG tablet 1 full tablet 3 times daily Patient taking differently: Take 1 tablet by mouth 3 (three) times daily.  12/06/19  Yes York Spaniel, MD  Cholecalciferol (VITAMIN D) 50 MCG (2000 UT) tablet Take 2,000 Units by mouth daily.   Yes [provider]  diphenhydrAMINE (BENADRYL) 25 MG tablet Take 50 mg by mouth at bedtime. Alternates with Trazodone   Yes [provider]  meloxicam (MOBIC) 15 MG tablet Take 7.5 mg by mouth daily.   Yes [provider]  Multiple Vitamins-Minerals (MULTIVITAMIN PO) Take 1 tablet by mouth daily.   Yes [provider]  Omega-3 Fatty Acids (FISH OIL PO) Take 1 capsule by mouth daily.   Yes [provider]  polyethylene glycol (MIRALAX / GLYCOLAX) 17 g packet  Take 17 g by mouth 2 (two) times daily. 01/19/20  Yes Babish, Molli Hazard, PA-C  pramipexole (MIRAPEX) 0.5 MG tablet Take 1 tablet (0.5 mg total) by mouth 3 (three) times daily. 12/06/19  Yes York Spaniel, MD  simvastatin (ZOCOR) 20 MG tablet Take 20 mg by mouth every other day.  04/13/18  Yes [provider]  traZODone (DESYREL) 50 MG tablet Take 1 tablet (50 mg total) by mouth at bedtime. 03/16/20  Yes York Spaniel, MD  vitamin B-12 (CYANOCOBALAMIN) 1000 MCG tablet Take 1,000 mcg by mouth daily.   Yes [provider]  vitamin C (ASCORBIC ACID) 500 MG tablet Take 500 mg by mouth daily.   Yes [provider]     ROS:  Out of a complete 14 system review of symptoms, the patient complains only of the following symptoms, and all other reviewed systems are negative.  Tremor  Blood pressure 136/71, pulse 72, height 5\' 4"  (1.626 m), weight 173 lb (78.5 kg).  Physical Exam  General: The patient is alert and cooperative at the time of the examination.  Skin: No significant peripheral edema is noted.   Neurologic Exam  Mental status: The patient is alert and oriented x 3 at the time of the examination. The patient has apparent normal recent and remote memory, with an apparently normal attention span and concentration ability.   Cranial nerves: Facial symmetry is present. Speech is normal, no aphasia or dysarthria is noted. Extraocular movements are full. Visual fields are full.  Motor: The patient has good strength in all 4 extremities.  Sensory examination: Soft touch sensation is symmetric on the face, arms, and legs.  Coordination: The patient has good finger-nose-finger and heel-to-shin bilaterally.  The patient has a prominent resting tremor with the right upper extremity.  Gait and station: The patient has a normal gait, with exception of some minimal decreased arm swing on the right.  She is able to arise from the seated position with arms crossed. Tandem gait is slightly unsteady. Romberg is negative. No drift is seen.  Reflexes: Deep tendon reflexes are symmetric.   Assessment/Plan:  1.  Parkinson's disease  The patient continues to have a significant resting tremor on the right, medication for Parkinson disease is likely not to be helpful.  A deep brain stimulator would eliminate the tremor.  The patient will remain on her current dose of Sinemet and Mirapex, she will be increased on the trazodone for sleep at night to 150 mg at night.  She will follow-up here in 5 months.  MD 06/05/2020 12:47 PM  [facility] [address] [place], [place]  Phone [phone] Fax [phone]

## 2020-06-08 ENCOUNTER — Telehealth: Payer: Self-pay | Admitting: Neurology

## 2020-06-08 MED ORDER — TRAZODONE HCL 100 MG PO TABS: 100.0000 mg | ORAL_TABLET | Freq: Every day | ORAL | 1 refills | Status: DC

## 2020-06-08 NOTE — Telephone Encounter (Signed)
Pt states 150mg  of traZODone (DESYREL) Was called in for her.  Pt states this is too much of a dose.  Pt is asking that Dr calls in a 100mg 

## 2020-06-08 NOTE — Telephone Encounter (Signed)
I will reduce the dose on the trazodone.

## 2020-06-08 NOTE — Addendum Note (Signed)
Addended by: York Spaniel on: 06/08/2020 02:27 PM   Modules accepted: Orders

## 2020-10-31 ENCOUNTER — Telehealth: Payer: Self-pay

## 2020-10-31 NOTE — Telephone Encounter (Signed)
Patient states that she has never had any trouble sleeping until she was diagnosed with Parkinsons` and her hands shaking wakes her up.    She also tried the CBD gummies with same results as Trazodone, only able to sleep 4-5 hours and then wide awake.  When she wakes up she finds her hands are trembling.    Patient is aware that Dr. Anne Hahn is out of office until Monday and is willing to wait for him to return her call.

## 2020-10-31 NOTE — Telephone Encounter (Signed)
Pt is calling to report that the trazodone 100mg  is not helping. She says that she will sleep for about 6 hours and then be wide awake and not be able to get back to sleep. She is requesting something stronger.

## 2020-11-01 NOTE — Telephone Encounter (Signed)
I called the patient.  The patient is on trazodone 100 mg at night.  She is tolerating this well, I would recommend that she go to 150 at night, if she tolerates this dose well and it is the right dose for her, she is to call our office and I will send in a prescription for the 150 mg tablets.

## 2020-12-06 ENCOUNTER — Ambulatory Visit (INDEPENDENT_AMBULATORY_CARE_PROVIDER_SITE_OTHER): Payer: Medicare Other | Admitting: Neurology

## 2020-12-06 ENCOUNTER — Other Ambulatory Visit: Payer: Self-pay

## 2020-12-06 ENCOUNTER — Encounter: Payer: Self-pay | Admitting: Neurology

## 2020-12-06 VITALS — BP 123/67 | HR 68 | Ht 64.0 in | Wt 178.0 lb

## 2020-12-06 DIAGNOSIS — G20 Parkinson's disease: Secondary | ICD-10-CM | POA: Diagnosis not present

## 2020-12-06 MED ORDER — CARBIDOPA-LEVODOPA 25-100 MG PO TABS: ORAL_TABLET | ORAL | 3 refills | Status: DC

## 2020-12-06 MED ORDER — TRAZODONE HCL 150 MG PO TABS: 150.0000 mg | ORAL_TABLET | Freq: Every day | ORAL | 1 refills | Status: DC

## 2020-12-06 MED ORDER — PRAMIPEXOLE DIHYDROCHLORIDE 0.5 MG PO TABS: 0.5000 mg | ORAL_TABLET | Freq: Three times a day (TID) | ORAL | 3 refills | Status: DC

## 2020-12-06 NOTE — Progress Notes (Signed)
Reason for visit: Parkinson's disease  Shelley Espinoza is an 80 y.o. female  History of present illness:  Shelley Espinoza is an 80 year old right-handed white female with history of Parkinson's disease with a prominent right upper extremity tremor.  The patient has remained quite active, she does note some mild alteration in balance but she has not had any falls.  She is sleeping better with the trazodone taking 150 mg at night.  She recently has been given eyedrops as an antibiotic by her eye doctor to treat an infection.  Since starting the eyedrops, she has had virtually daily episodes of visual disturbance.  She has had similar visual disturbances off and on once every 6 months or so over the last 15 years, she was told that they were a migraine equivalent.  These episodes have become virtually daily now lasting about half an hour.  The patient will have some visual loss and dimming of the vision bilaterally, then it will return.  This generally occurs in the morning.  She has been on the eyedrop antibiotics for about 4 and half weeks.  Past Medical History:  Diagnosis Date  . Arthritis   . Chronic insomnia 12/06/2019  . High cholesterol   . Overactive bladder   . Parkinson's disease (HCC) 11/18/2018  . Tremor    right night    Past Surgical History:  Procedure Laterality Date  . CATARACT EXTRACTION, BILATERAL     age 18  . COSMETIC SURGERY    . EYE SURGERY    . martins nueroma     feet  . TOTAL HIP ARTHROPLASTY Right 10/12/2019   Procedure: TOTAL HIP ARTHROPLASTY ANTERIOR APPROACH;  Surgeon: Durene Romans, MD;  Location: [facility];  Service: Orthopedics;  Laterality: Right;  70 mins  . TOTAL HIP ARTHROPLASTY Left 01/18/2020   Procedure: TOTAL HIP ARTHROPLASTY ANTERIOR APPROACH;  Surgeon: Durene Romans, MD;  Location: [facility];  Service: Orthopedics;  Laterality: Left;   . TUBAL LIGATION    . VAGINAL DELIVERY      Family History  Problem Relation Age of Onset  . Stroke Mother    . Hypertension Father   . Heart disease Father   . Schizophrenia Sister   . Parkinsonism Brother     Social history:  reports that she quit smoking about 42 years ago. She has never used smokeless tobacco. She reports current alcohol use of about 7.0 standard drinks of alcohol per week. She reports that she does not use drugs.   No Known Allergies  Medications:  Prior to Admission medications   Medication Sig Start Date End Date Taking? Authorizing Provider  Biotin 7500 MCG TABS Take 7,500 mcg by mouth daily.   Yes [provider]  Calcium Carb-Cholecalciferol (CALCIUM 600 + D PO) Take 1 tablet by mouth 2 (two) times daily.   Yes [provider]  carbidopa-levodopa (SINEMET IR) 25-100 MG tablet 1 full tablet 3 times daily 06/05/20  Yes York Spaniel, MD  Cholecalciferol (VITAMIN D) 50 MCG (2000 UT) tablet Take 2,000 Units by mouth daily.   Yes [provider]  erythromycin ophthalmic ointment erythromycin 5 mg/gram (0.5 %) eye ointment   Yes [provider]  meloxicam (MOBIC) 15 MG tablet Take 7.5 mg by mouth daily.   Yes [provider]  Multiple Vitamins-Minerals (MULTIVITAMIN PO) Take 1 tablet by mouth daily.   Yes [provider]  neomycin-polymyxin-dexamethasone (MAXITROL) 0.1 % ophthalmic suspension neomycin-polymyxin-dexameth 3.5 mg/mL-10,000 unit/mL-0.1% eye drops  INSTILL 1 DROP INTO EACH EYE 4 TIMES DAILY   Yes [provider]  Omega-3 Fatty Acids (FISH OIL PO) Take 1 capsule by mouth daily.   Yes [provider]  OVER THE COUNTER MEDICATION Urinary tract supplement   Yes [provider]  polyethylene glycol (MIRALAX / GLYCOLAX) 17 g packet Take 17 g by mouth 2 (two) times daily. Patient taking differently: Take 17 g by mouth daily. 01/19/20  Yes Babish, Molli Hazard, PA-C  pramipexole (MIRAPEX) 0.5 MG tablet Take 1 tablet (0.5 mg total) by mouth 3 (three) times daily. 06/05/20  Yes York Spaniel, MD   simvastatin (ZOCOR) 20 MG tablet Take 20 mg by mouth every other day.  04/13/18  Yes [provider]  vitamin B-12 (CYANOCOBALAMIN) 1000 MCG tablet Take 1,000 mcg by mouth daily.   Yes [provider]  vitamin C (ASCORBIC ACID) 500 MG tablet Take 500 mg by mouth daily.   Yes [provider]    ROS:  Out of a complete 14 system review of symptoms, the patient complains only of the following symptoms, and all other reviewed systems are negative.  Vision disturbance Tremor  Blood pressure 123/67, pulse 68, height 5\' 4"  (1.626 m), weight 178 lb (80.7 kg).  Physical Exam  General: The patient is alert and cooperative at the time of the examination.  Skin: No significant peripheral edema is noted.   Neurologic Exam  Mental status: The patient is alert and oriented x 3 at the time of the examination. The patient has apparent normal recent and remote memory, with an apparently normal attention span and concentration ability.   Cranial nerves: Facial symmetry is present. Speech is normal, no aphasia or dysarthria is noted. Extraocular movements are full. Visual fields are full.  Motor: The patient has good strength in all 4 extremities.  Sensory examination: Soft touch sensation is symmetric on the face, arms, and legs.  Coordination: The patient has good finger-nose-finger and heel-to-shin bilaterally.  A prominent resting tremor seen with the right upper extremity.  Gait and station: The patient has a normal gait. Tandem gait is normal. Romberg is negative. No drift is seen.  Reflexes: Deep tendon reflexes are symmetric.   Assessment/Plan:  1.  Parkinson's disease  2.  Transient visual disturbance  The patient is having frequent episodes of what she has been told were episodes of migraine equivalent.  The patient will be stopping the antibiotics for the eyes soon, if the vision issue does not improve, the patient will contact our office, we may  consider medication to help prevent the episodes from occurring.  The patient will continue her Mirapex and Sinemet and trazodone, she will follow up here in 6 months.  MD 12/06/2020 12:26 PM  [facility] [address] [place], [place]  Phone [phone] Fax [phone]

## 2021-01-22 ENCOUNTER — Telehealth: Payer: Self-pay | Admitting: Neurology

## 2021-01-22 NOTE — Telephone Encounter (Signed)
Pt has an unstoppable cough, she would like to know if Dr Anne Hahn can make a suggestion of what she can take since she has  Parkinson's

## 2021-01-25 NOTE — Telephone Encounter (Signed)
Called the patient back. There was no answer, left a detailed message apologizing for the delay in responding.  Advised that Dr. Anne Hahn was out of the office.  Advised the patient through voicemail, she should contact her primary care physician for the cough.  They are able to review the medications that she takes to make sure they do not give anything that would interact.  Instructed the patient that if she has any other concerns definitely reach back out to [place].

## 2021-05-17 ENCOUNTER — Telehealth: Payer: Self-pay | Admitting: Neurology

## 2021-05-17 NOTE — Telephone Encounter (Signed)
Contacted pt back, LVM for her to return my call  

## 2021-05-17 NOTE — Telephone Encounter (Signed)
Pt called wanting to speak to the RN regarding the dizziness she has been experiencing since her parkinson's started. Please advise.

## 2021-06-18 ENCOUNTER — Ambulatory Visit: Payer: Medicare Other | Admitting: Neurology

## 2021-06-21 ENCOUNTER — Other Ambulatory Visit: Payer: Self-pay | Admitting: Neurology

## 2021-06-27 ENCOUNTER — Telehealth: Payer: Self-pay | Admitting: Neurology

## 2021-06-27 ENCOUNTER — Ambulatory Visit (INDEPENDENT_AMBULATORY_CARE_PROVIDER_SITE_OTHER): Payer: Medicare Other | Admitting: Neurology

## 2021-06-27 ENCOUNTER — Encounter: Payer: Self-pay | Admitting: Neurology

## 2021-06-27 ENCOUNTER — Other Ambulatory Visit: Payer: Self-pay

## 2021-06-27 VITALS — BP 122/63 | HR 64 | Ht 64.0 in | Wt 177.0 lb

## 2021-06-27 DIAGNOSIS — G459 Transient cerebral ischemic attack, unspecified: Secondary | ICD-10-CM | POA: Diagnosis not present

## 2021-06-27 DIAGNOSIS — G20 Parkinson's disease: Secondary | ICD-10-CM

## 2021-06-27 DIAGNOSIS — H814 Vertigo of central origin: Secondary | ICD-10-CM | POA: Diagnosis not present

## 2021-06-27 MED ORDER — CARBIDOPA-LEVODOPA 25-100 MG PO TABS: ORAL_TABLET | ORAL | 3 refills | Status: DC

## 2021-06-27 MED ORDER — PRAMIPEXOLE DIHYDROCHLORIDE 0.5 MG PO TABS: 0.5000 mg | ORAL_TABLET | Freq: Three times a day (TID) | ORAL | 3 refills | Status: DC

## 2021-06-27 MED ORDER — TRAZODONE HCL 100 MG PO TABS: 200.0000 mg | ORAL_TABLET | Freq: Every day | ORAL | 1 refills | Status: DC

## 2021-06-27 NOTE — Progress Notes (Signed)
Reason for visit: Parkinson's disease, dizziness  Shelley Espinoza is an 80 y.o. female  History of present illness:  Shelley Espinoza is an 80 year old right-handed white female with a history of Parkinson's disease.  The patient has done well with her mobility, she continues to have a significant resting tremor with the right upper extremity.  She gives a longstanding history of positional vertigo, she has had to avoid sleeping on the right side of her head for many years because this would induce dizziness.  The patient however began having onset of some dizziness that began about 3 months ago.  The patient would wake up with dizziness that would last up to 2 days.  The dizziness has somewhat worsened over the last 2 months.  The patient has now started having brief episodes of significant dizziness that may occur without any head position change, this may be associated with some graying out of vision.  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  The patient has not had any falls.  She has had to restrict some of her physical activity because of the dizziness.  She now notes that if she looks up or looks down dizziness will ensue.  She has not had any numbness or weakness of the face, arms, legs.  She denies any loss of vision or double vision or slurred speech.  She denies headaches.  She has not had any palpitations of the heart or chest pain.  She comes here for further evaluation.  Past Medical History:  Diagnosis Date   Arthritis    Chronic insomnia 12/06/2019   High cholesterol    Overactive bladder    Parkinson's disease (HCC) 11/18/2018   Tremor    right night    Past Surgical History:  Procedure Laterality Date   CATARACT EXTRACTION, BILATERAL     age 24   COSMETIC SURGERY     EYE SURGERY     martins nueroma     feet   TOTAL HIP ARTHROPLASTY Right 10/12/2019   Procedure: TOTAL HIP ARTHROPLASTY ANTERIOR APPROACH;  Surgeon: Durene Romans, MD;   Location: [facility];  Service: Orthopedics;  Laterality: Right;  70 mins   TOTAL HIP ARTHROPLASTY Left 01/18/2020   Procedure: TOTAL HIP ARTHROPLASTY ANTERIOR APPROACH;  Surgeon: Durene Romans, MD;  Location: [facility];  Service: Orthopedics;  Laterality: Left;    TUBAL LIGATION     VAGINAL DELIVERY      Family History  Problem Relation Age of Onset   Stroke Mother    Hypertension Father    Heart disease Father    Schizophrenia Sister    Parkinsonism Brother     Social history:  reports that she quit smoking about 42 years ago. Her smoking use included cigarettes. She has never used smokeless tobacco. She reports current alcohol use of about 7.0 standard drinks per week. She reports that she does not use drugs.   No Known Allergies  Medications:  Prior to Admission medications   Medication Sig Start Date End Date Taking? Authorizing Provider  Biotin 7500 MCG TABS Take 7,500 mcg by mouth daily.   Yes [provider]  Calcium Carb-Cholecalciferol (CALCIUM 600 + D PO) Take 1 tablet by mouth 2 (two) times daily.   Yes [provider]  carbidopa-levodopa (SINEMET IR) 25-100 MG tablet 1 full tablet 3 times daily 12/06/20  Yes York Spaniel, MD  latanoprost (XALATAN) 0.005 % ophthalmic solution Place 1 drop into  both eyes at bedtime.   Yes [provider]  meloxicam (MOBIC) 15 MG tablet Take 7.5 mg by mouth daily.   Yes [provider]  Multiple Vitamins-Minerals (MULTIVITAMIN PO) Take 1 tablet by mouth daily.   Yes [provider]  neomycin-polymyxin-dexamethasone (MAXITROL) 0.1 % ophthalmic suspension neomycin-polymyxin-dexameth 3.5 mg/mL-10,000 unit/mL-0.1% eye drops  INSTILL 1 DROP INTO EACH EYE 4 TIMES DAILY   Yes [provider]  Omega-3 Fatty Acids (FISH OIL PO) Take 1 capsule by mouth daily.   Yes [provider]  OVER THE COUNTER MEDICATION Urinary tract supplement   Yes [provider]  polyethylene glycol  (MIRALAX / GLYCOLAX) 17 g packet Take 17 g by mouth 2 (two) times daily. Patient taking differently: Take 17 g by mouth daily. 01/19/20  Yes Babish, Molli Hazard, PA-C  pramipexole (MIRAPEX) 0.5 MG tablet Take 1 tablet (0.5 mg total) by mouth 3 (three) times daily. 12/06/20  Yes York Spaniel, MD  simvastatin (ZOCOR) 20 MG tablet Take 20 mg by mouth every other day.  04/13/18  Yes [provider]  traZODone (DESYREL) 150 MG tablet Take 1 tablet (150 mg total) by mouth at bedtime. 12/06/20  Yes York Spaniel, MD  vitamin B-12 (CYANOCOBALAMIN) 1000 MCG tablet Take 1,000 mcg by mouth daily.   Yes [provider]  vitamin C (ASCORBIC ACID) 500 MG tablet Take 500 mg by mouth daily.   Yes [provider]  erythromycin ophthalmic ointment erythromycin 5 mg/gram (0.5 %) eye ointment Patient not taking: Reported on 06/27/2021    [provider]    ROS:  Out of a complete 14 system review of symptoms, the patient complains only of the following symptoms, and all other reviewed systems are negative.  Dizziness Tremor  Blood pressure 122/63, pulse 64, height 5\' 4"  (1.626 m), weight 177 lb (80.3 kg).  Blood pressure, sitting, left arm is 142/84.  Blood pressure, left arm, standing is 132/80.  Physical Exam  General: The patient is alert and cooperative at the time of the examination.  Skin: No significant peripheral edema is noted.   Neurologic Exam  Mental status: The patient is alert and oriented x 3 at the time of the examination. The patient has apparent normal recent and remote memory, with an apparently normal attention span and concentration ability.   Cranial nerves: Facial symmetry is present. Speech is normal, no aphasia or dysarthria is noted. Extraocular movements are full. Visual fields are full.  Motor: The patient has good strength in all 4 extremities.  Sensory examination: Soft touch sensation is symmetric on the face, arms, and  legs.  Coordination: The patient has good finger-nose-finger and heel-to-shin bilaterally.  Prominent resting tremors noted with the right upper extremity.  Gait and station: The patient has a normal gait.  She has fairly good arm swing bilaterally.  Tandem gait is slightly unsteady. Romberg is negative. No drift is seen.  Reflexes: Deep tendon reflexes are symmetric.   Assessment/Plan:  1.  Parkinson's disease  2.  History of positional vertigo  3.  Recurrence of vertigo within the last 2 to 3 months  The patient could be having some recurrence of her positional vertigo, but clearly the symptoms above are new for her.  We will pursue another MRI of the brain, get MRA of the head to exclude cerebrovascular disease.  The patient will be sent for vestibular rehabilitation.  She was given prescriptions for her trazodone, the dose will be increased to 200  mg at night for her chronic insomnia.  The patient remain on Mirapex and on the Sinemet at the current dose.  She will follow-up here in 6 months, she may see Dr. Frances Furbish in the future.  Marlan Palau MD 06/27/2021 12:31 PM  [facility] [address] [place], [place] [address]  Phone [phone] Fax [phone]

## 2021-06-27 NOTE — Telephone Encounter (Signed)
Medicare/[place] of [place] order sent to [facility]. [facility] they will reach out to the patient to schedule.

## 2021-07-02 ENCOUNTER — Telehealth: Payer: Self-pay | Admitting: Neurology

## 2021-07-02 NOTE — Telephone Encounter (Signed)
PT referral sent to [facility] in [place]. Phone: [phone].

## 2021-07-11 ENCOUNTER — Ambulatory Visit
Admission: RE | Admit: 2021-07-11 | Discharge: 2021-07-11 | Disposition: A | Discharge status: Home / Self Care | Payer: Medicare Other | Source: Ambulatory Visit | Attending: Neurology | Admitting: Neurology

## 2021-07-11 ENCOUNTER — Other Ambulatory Visit: Payer: Self-pay

## 2021-07-11 DIAGNOSIS — H814 Vertigo of central origin: Secondary | ICD-10-CM

## 2021-07-11 DIAGNOSIS — G459 Transient cerebral ischemic attack, unspecified: Secondary | ICD-10-CM | POA: Diagnosis not present

## 2021-07-14 ENCOUNTER — Telehealth: Payer: Self-pay | Admitting: Neurology

## 2021-07-14 NOTE — Telephone Encounter (Signed)
I called the patient.  MRI of the brain shows no new white matter changes from 3 years ago.  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  She will be entering into vestibular rehab.  MRA of the head shows some atherosclerotic changes, possibility of flow-limiting stenosis in the left posterior cerebral artery.  I have asked the patient to go on low-dose aspirin, 81 mg daily.   MRI brain 07/11/21:  IMPRESSION: This MRI of the brain without contrast shows the following: 1.   Moderate generalized cortical atrophy that has slightly progressed over 3 years compared to the 2019 MRI. 2.   Scattered T2/FLAIR hyperintense foci most consistent with mild chronic microvascular ischemic change, unchanged compared to the 2019 MRI. 3.   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  Minimal chronic inflammatory changes in the ethmoid air cells. 4.   No acute findings in the brain.   MRA head 07/11/21:  IMPRESSION: This MR angiogram of the intracranial arteries shows intracranial stenosis as detailed above.  Specifically: 1.   Patchy stenosis of less than 20% in the supraclinoid segments of both internal carotid arteries.  This is not hemodynamically significant. 2.   Mild to moderate stenosis in 1 of the proximal M3 segments.  As flow appears normal more distally, this is not hemodynamically significant. 3.   Patchy stenosis in the P2 segment of the left posterior cerebral artery that could be hemodynamically significant as there appears to be reduced P3 flow 4.   Patchy stenosis in the P3 segment of the right posterior cerebral artery that is unlikely to be hemodynamically significant. 5.   No aneurysms.

## 2021-08-22 ENCOUNTER — Telehealth: Payer: Self-pay | Admitting: Neurology

## 2021-08-22 NOTE — Telephone Encounter (Signed)
Pt called states she is needing another referral for PT.

## 2021-08-22 NOTE — Telephone Encounter (Signed)
Contacted pt back, asked her if she is requesting a referral for continuation of PT or to start it?  Pt stated it was for continuation but it is no longer needed, PCP sent it in for her. Advised to let us know if she needs anything else, she was appreciative of the call back

## 2021-10-16 ENCOUNTER — Telehealth: Payer: Self-pay | Admitting: *Deleted

## 2021-10-16 MED ORDER — CARBIDOPA-LEVODOPA 25-100 MG PO TABS: ORAL_TABLET | ORAL | 3 refills | Status: DC

## 2021-10-16 MED ORDER — PRAMIPEXOLE DIHYDROCHLORIDE 0.5 MG PO TABS: 0.5000 mg | ORAL_TABLET | Freq: Three times a day (TID) | ORAL | 3 refills | Status: DC

## 2021-10-16 NOTE — Addendum Note (Signed)
Addended by: Guy Begin on: 10/16/2021 03:57 PM   Modules accepted: Orders

## 2021-10-16 NOTE — Telephone Encounter (Signed)
Okay to fill her PD prescriptions (Sinemet and Mirapex) at the current doses for now.  However, she has a prescription for high-dose trazodone.  I am not comfortable maintaining this prescription at this high a dose.  Please verify if she is taking 200 mg of trazodone each night.  We may have to consider reducing this. If she would prefer, she can also discuss with her primary care if they are comfortable maintaining her on a high dose trazodone for now.  At the next appointment we may have to discuss coming off of trazodone or reducing it. I do not favor this medication in the elderly population especially in Parkinson's patients. Please also verify with patient:  she appears to have a valid prescription is for both Sinemet and Mirapex on file, sent to [facility].  If this needs to be sent to a different pharmacy, you can initiate these changes after talking to patient.

## 2021-10-16 NOTE — Telephone Encounter (Signed)
I called pt and relayed that Dr. Frances Furbish ok to fill sinemet and mirapex for her at [facility]) for next year.  The trazodone I relayed Dr. Frances Furbish not comfortable maintaining at that dose (pcp may fill if ok and will do) in elderly pts with PD.  Pt states that worked well and is now losing effectiveness. She has enough to get to her appt in 12-27-21 with Dr. Frances Furbish and they will discuss other options at appt.  She appreciated callback.

## 2021-10-16 NOTE — Telephone Encounter (Signed)
We received a refill request from [facility] pharmacy for pt's Trazodone 100 mg, Carbidopa-levodopa 25-100 mg, and Pramipexole 0.5 mg. Patient has received this from Dr Anne Hahn, last seen 06/27/21. Per his note, patient can see Dr Frances Furbish in the future and pt has a pending appt on 12/26/21. Will send Rx requests to Dr Frances Furbish.

## 2021-11-09 IMAGING — DX DG PORTABLE PELVIS
1 series · 1 of 1 positions shown · non-contrast
Comparison: Intraoperative fluoroscopy 10/12/2019, MRI abdomen
08/19/2019

CLINICAL DATA: Postoperative right side anterior approach total hip
replacement, history of osteoarthrosis

EXAM:
PORTABLE PELVIS 1-2 VIEWS

[pelvis ap]
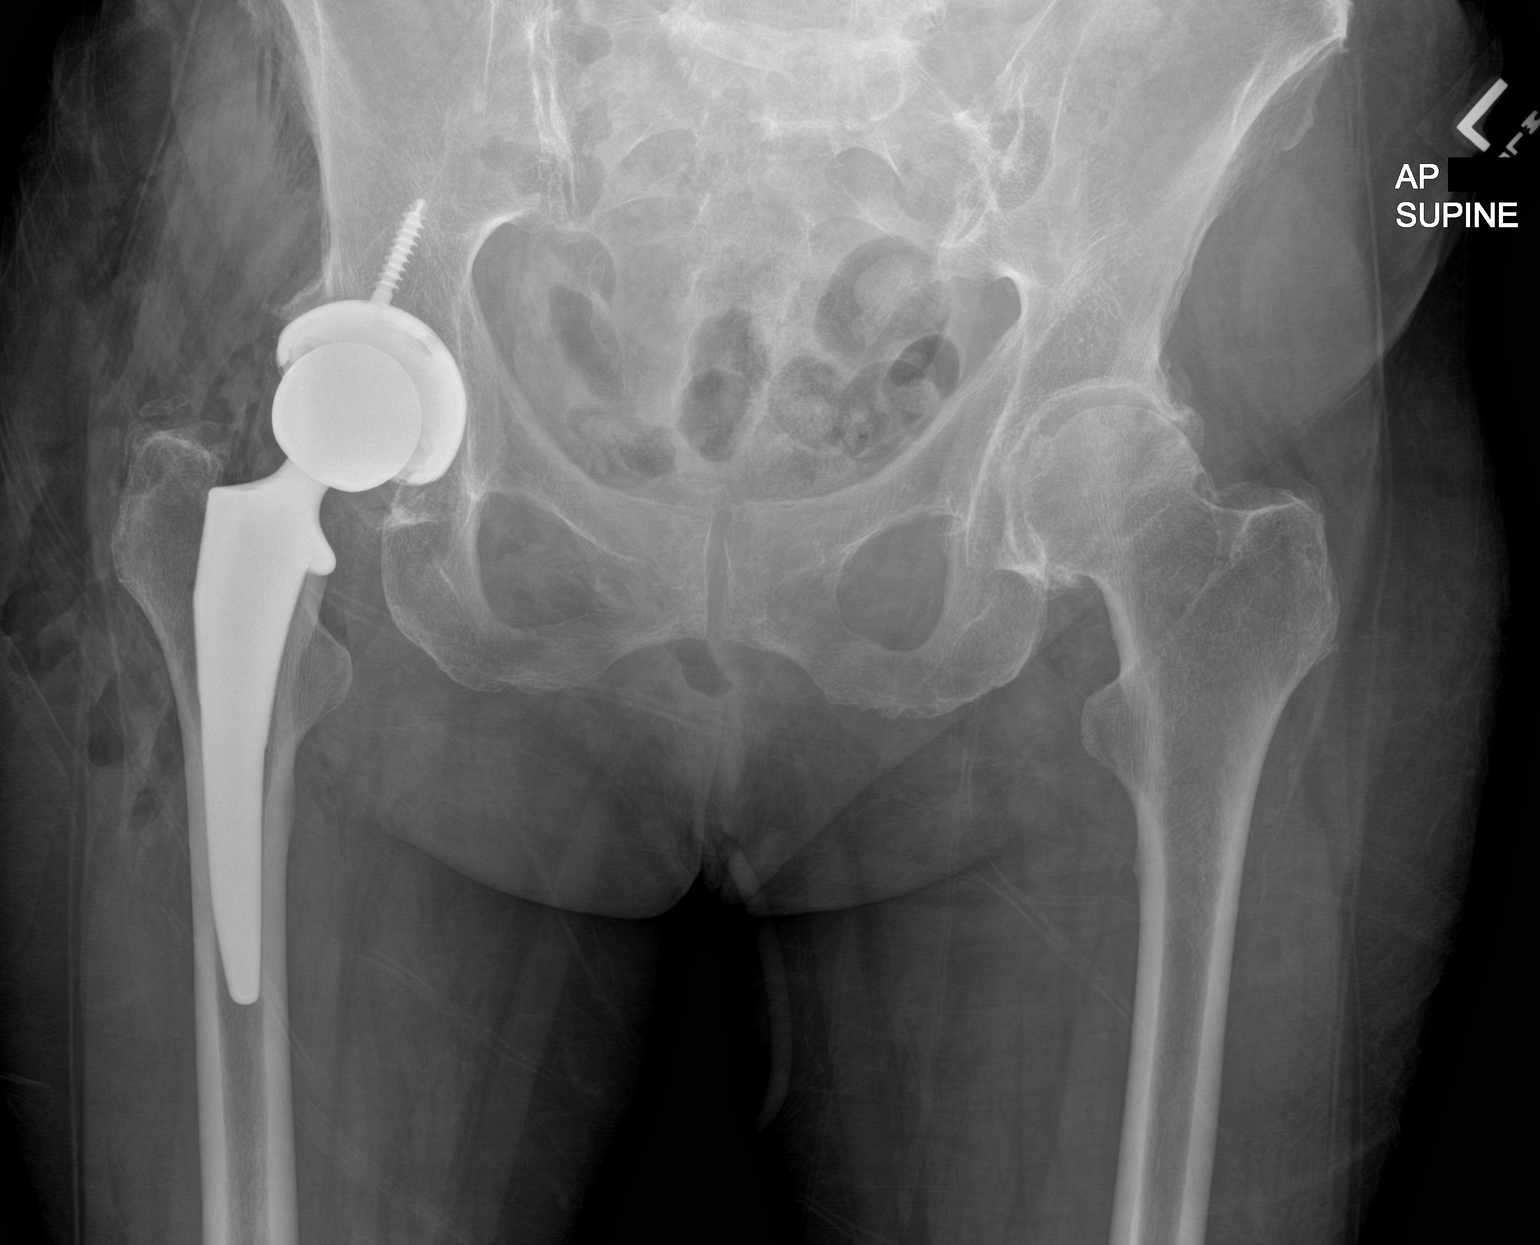

[1 of 1 positions shown; findings below may reference images not displayed]

FINDINGS: Patient is post total right hip arthroplasty with screw fixed
acetabular component normally aligned with the articulating femoral
component. Expected soft tissue gas and postsurgical changes noted.
Moderate left hip osteoarthrosis is present bones of the pelvis are
congruent. Remaining soft tissues are unremarkable.
IMPRESSION: Status post right total hip arthroplasty without evidence for
immediate hardware complication.

## 2021-12-26 ENCOUNTER — Encounter: Payer: Self-pay | Admitting: Neurology

## 2021-12-26 ENCOUNTER — Ambulatory Visit: Payer: Medicare PPO | Admitting: Neurology

## 2021-12-26 VITALS — BP 132/70 | HR 71 | Ht 64.0 in | Wt 178.0 lb

## 2021-12-26 DIAGNOSIS — G20 Parkinson's disease: Secondary | ICD-10-CM | POA: Diagnosis not present

## 2021-12-26 DIAGNOSIS — R413 Other amnesia: Secondary | ICD-10-CM

## 2021-12-26 DIAGNOSIS — R2689 Other abnormalities of gait and mobility: Secondary | ICD-10-CM | POA: Diagnosis not present

## 2021-12-26 DIAGNOSIS — G479 Sleep disorder, unspecified: Secondary | ICD-10-CM

## 2021-12-26 DIAGNOSIS — K5909 Other constipation: Secondary | ICD-10-CM

## 2021-12-26 NOTE — Patient Instructions (Addendum)
It was nice to meet you and Shelley Espinoza today.  As discussed, we will keep your Parkinson's medications the same.  We will reduce and potentially taper off completely of trazodone.  Please reduce it to 1-1/2 pills at bedtime for the next 2 weeks, then 1 pill at bedtime for 2 weeks, then half a pill at bedtime thereafter.  Once you are on half a pill at bedtime, you can start melatonin 5 mg strength take 1 pill 1 hour before bedtime, it is over-the-counter. You can increase after 1 week to 2 pills, and after another week to up to 3 pills.  Your levodopa and pramipexole prescriptions are up-to-date.  Please follow-up routinely in 6 months, sooner if needed.  We will monitor your memory function, try to hydrate well and exercise on a regular basis, daily if possible, you can use your stationary bike.  Please avoid drinking alcohol on a daily basis, limit yourself to less than 1 drink per day.  Please have your family monitor your driving.  I would suggest at this point only local roads, familiar routes, no nighttime and no highway driving.

## 2021-12-26 NOTE — Progress Notes (Signed)
Subjective:    Patient ID: Shelley Espinoza is a 81 y.o. female.  HPI    Interim history:   Ms. Shelley Espinoza is an 81 year old right-handed woman with an underlying medical history of hyperlipidemia, migraine headaches, arthritis with status post bilateral hip replacements, status post bilateral cataract extractions, overactive bladder, vertigo, and borderline obesity, who presents for follow-up consultation of her right sided Parkinson's disease.  She is accompanied by her daughter-in-law, Shelley Espinoza, today.  She previously followed by Dr. Stephanie Acre and was last seen by him on 06/27/2021.  I reviewed the note and copied the note below for reference.  She has been on pramipexole 0.5 mg 3 times daily and levodopa 25-100 mg strength 1 pill 3 times daily.  Today, 12/26/2021: She reports feeling stable with regards to her Parkinson's but she does not sleep well.  She endorses stress what with her husband's recent surgery and he had a fall as well.  She lives with her husband, her son and daughter-in-law live half a mile away, her daughter lives in [place].  She does not sleep through the night, she feels intermittently dizzy and off balance, has not had any recent falls.  Daughter-in-law reports that she has been found to be more forgetful in the past 3 to 4 months.  She has had more stress.  She has a longstanding history of difficulty sleeping.  She apparently tried high-dose Benadryl in the past and also some melatonin, unclear of the dosing.  She has been on trazodone 200 mg at bedtime per Dr. Anne Hahn.  Patient believes that it is not helping and she is suspecting that her balance issues are in part because of the trazodone, she feels that it makes her dizzy.  She tries to hydrate well.  In fact, in the past few days she started a new diet that includes 5 smaller meals and a lot of water.  She drinks alcohol on a regular basis, typically 1 or 2 glasses, up to 3 glasses of wine or liquor but reports  that of the diet she is not currently consuming any alcohol.  She has constipation for which she takes MiraLAX nearly daily.  She admits to being forgetful.  She is not able to give very many details about her history and some details are provided by her daughter-in-law.  She has had a right hand tremor dating back to early 2019.  She has no family history of Parkinson's disease.  She drinks caffeine in the form of coffee, 1 cup in the morning.  She quit smoking in the 70s.  The patient's allergies, current medications, family history, past medical history, past social history, past surgical history and problem list were reviewed and updated as appropriate.   Previously:  06/27/2021 (Dr. Anne Hahn): <<Ms. [name] is an 81 year old right-handed white female with a history of Parkinson's disease.  The patient has done well with her mobility, she continues to have a significant resting tremor with the right upper extremity.  She gives a longstanding history of positional vertigo, she has had to avoid sleeping on the right side of her head for many years because this would induce dizziness.  The patient however began having onset of some dizziness that began about 3 months ago.  The patient would wake up with dizziness that would last up to 2 days.  The dizziness has somewhat worsened over the last 2 months.  The patient has now started having brief episodes of significant dizziness that may occur without any  head position change, this may be associated with some graying out of vision.  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  The patient has not had any falls.  She has had to restrict some of her physical activity because of the dizziness.  She now notes that if she looks up or looks down dizziness will ensue.  She has not had any numbness or weakness of the face, arms, legs.  She denies any loss of vision or double vision or slurred speech.  She denies headaches.  She has  not had any palpitations of the heart or chest pain.  She comes here for further evaluation. >>  Her Past Medical History Is Significant For: Past Medical History:  Diagnosis Date   Arthritis    Chronic insomnia 12/06/2019   High cholesterol    Overactive bladder    Parkinson's disease (HCC) 11/18/2018   Tremor    right night    Her Past Surgical History Is Significant For: Past Surgical History:  Procedure Laterality Date   CATARACT EXTRACTION, BILATERAL     age 50   COSMETIC SURGERY     EYE SURGERY     martins nueroma     feet   TOTAL HIP ARTHROPLASTY Right 10/12/2019   Procedure: TOTAL HIP ARTHROPLASTY ANTERIOR APPROACH;  Surgeon: Durene Romans, MD;  Location: [facility];  Service: Orthopedics;  Laterality: Right;  70 mins   TOTAL HIP ARTHROPLASTY Left 01/18/2020   Procedure: TOTAL HIP ARTHROPLASTY ANTERIOR APPROACH;  Surgeon: Durene Romans, MD;  Location: [facility];  Service: Orthopedics;  Laterality: Left;    TUBAL LIGATION     VAGINAL DELIVERY      Her Family History Is Significant For: Family History  Problem Relation Age of Onset   Stroke Mother    Hypertension Father    Heart disease Father    Schizophrenia Sister    Parkinsonism Brother     Her Social History Is Significant For: Social History   Socioeconomic History   Marital status: Married    Spouse name: Roe Coombs   Number of children: 2   Years of education: some college   Highest education level: Not on file  Occupational History   Not on file  Tobacco Use   Smoking status: Former    Types: Cigarettes    Quit date: 1980    Years since quitting: 43.1   Smokeless tobacco: Never   Tobacco comments:    Quit 1980   off and on for 10 years  Vaping Use   Vaping Use: Never used  Substance and Sexual Activity   Alcohol use: Yes    Alcohol/week: 7.0 standard drinks    Types: 7 Standard drinks or equivalent per week    Comment: 1 cocktail before dinner-wine or scotch   Drug use: Never   Sexual activity:  Not Currently  Other Topics Concern   Not on file  Social History Narrative   Lives with husband   Caffeine use: coffee 1 cup/day   Right handed    Social Determinants of Health   Financial Resource Strain: Not on file  Food Insecurity: Not on file  Transportation Needs: Not on file  Physical Activity: Not on file  Stress: Not on file  Social Connections: Not on file    Her Allergies Are:  No Known Allergies:   Her Current Medications Are:  Outpatient Encounter Medications as of 12/26/2021  Medication Sig   Biotin 7500 MCG  TABS Take 7,500 mcg by mouth daily.   Calcium Carb-Cholecalciferol (CALCIUM 600 + D PO) Take 1 tablet by mouth 2 (two) times daily.   carbidopa-levodopa (SINEMET IR) 25-100 MG tablet 1 full tablet 3 times daily   latanoprost (XALATAN) 0.005 % ophthalmic solution Place 1 drop into both eyes at bedtime.   Multiple Vitamins-Minerals (MULTIVITAMIN PO) Take 1 tablet by mouth daily.   MYRBETRIQ 50 MG TB24 tablet Take 50 mg by mouth daily.   Omega-3 Fatty Acids (FISH OIL PO) Take 1 capsule by mouth daily.   polyethylene glycol (MIRALAX / GLYCOLAX) 17 g packet Take 17 g by mouth 2 (two) times daily. (Patient taking differently: Take 17 g by mouth daily.)   pramipexole (MIRAPEX) 0.5 MG tablet Take 1 tablet (0.5 mg total) by mouth 3 (three) times daily.   simvastatin (ZOCOR) 20 MG tablet Take 20 mg by mouth every other day.    traZODone (DESYREL) 100 MG tablet Take 2 tablets (200 mg total) by mouth at bedtime.   vitamin B-12 (CYANOCOBALAMIN) 1000 MCG tablet Take 1,000 mcg by mouth daily.   vitamin C (ASCORBIC ACID) 500 MG tablet Take 500 mg by mouth daily.   [DISCONTINUED] erythromycin ophthalmic ointment erythromycin 5 mg/gram (0.5 %) eye ointment (Patient not taking: Reported on 06/27/2021)   [DISCONTINUED] meloxicam (MOBIC) 15 MG tablet Take 7.5 mg by mouth daily.   [DISCONTINUED] neomycin-polymyxin-dexamethasone (MAXITROL) 0.1 % ophthalmic suspension  neomycin-polymyxin-dexameth 3.5 mg/mL-10,000 unit/mL-0.1% eye drops  INSTILL 1 DROP INTO EACH EYE 4 TIMES DAILY   [DISCONTINUED] OVER THE COUNTER MEDICATION Urinary tract supplement (Patient not taking: Reported on 12/26/2021)   No facility-administered encounter medications on file as of 12/26/2021.  :  Review of Systems:  Out of a complete 14 point review of systems, all are reviewed and negative with the exception of these symptoms as listed below:   Review of Systems  Neurological:        Here for TOC of visit previously seen by Dr. Anne Hahn. Reports dizziness and fatigue are more of an issue for her now. Denies any falls. Would like to discuss coming off Trazodone feels like this could be causing some of her fatigue/dizziness episodes.   Objective:  Neurological Exam  Physical Exam Physical Examination:   Vitals:   12/26/21 1049  BP: 132/70  Pulse: 71    General Examination: The patient is a very pleasant 81 y.o. female in no acute distress. She appears well-developed and well-nourished and well groomed.   HEENT: Normocephalic, atraumatic, pupils are equal, round and reactive to light, extraocular tracking is fairly well-preserved, face is symmetric with mild facial masking, mild nuchal rigidity noted, no lip, neck or jaw tremor.  Airway examination reveals mild mouth dryness, tongue protrudes centrally and palate elevates symmetrically, no carotid bruits.   Chest: Clear to auscultation without wheezing, rhonchi or crackles noted.  Heart: S1+S2+0, regular and normal without murmurs, rubs or gallops noted.   Abdomen: Soft, non-tender and non-distended with normal bowel sounds appreciated on auscultation.  Extremities: There is no pitting edema in the distal lower extremities bilaterally.   Skin: Warm and dry without trophic changes noted.   Musculoskeletal: exam reveals no obvious joint deformities, tenderness or joint swelling or erythema.   Neurologically:  Mental  status: The patient is awake, alert and able to give appropriate answers, she is not able to give details of her history and does seem to be forgetful for chronological history.  No dysarthria, mood is normal and affect is  normal.  Thought process is linear. Cranial nerves II - XII are as described above under HEENT exam.  Motor exam: Normal bulk, global strength good, mild increase in tone in the right wrist, she has a fairly consistent right upper extremity resting tremor, no other tremors noted, no significant postural tremor.  Fine motor skills are mildly impaired on the right upper extremity, otherwise age-appropriate.  Romberg is not tested due to safety concerns.   Cerebellar testing: No dysmetria or intention tremor. There is no truncal or gait ataxia.  Sensory exam: intact to light touch in the upper and lower extremities.  Gait, station and balance: She stands without difficulty eyes any lightheadedness or vertiginous symptoms upon standing.  She has a fairly age-appropriate posture.  She walks with decreased arm swing on the right.  She turns with mild insecurity.  No walking aid.   Assessment and Plan:  In summary, Samuel JesterLinda Leopard is a very pleasant 81 y.o.-year old female with an underlying medical history of hyperlipidemia, migraine headaches, arthritis with status post bilateral hip replacements, status post bilateral cataract extractions, overactive bladder, vertigo, and borderline obesity, who presents for follow-up consultation of her right sided Parkinson's disease, complicated by chronic constipation, chronic sleep disturbance, and memory loss.  We will continue to monitor her symptoms and examination, we may do a MMSE next time.  Of note, she has been on Myrbetriq as of the last 1 to 2 months per her PCP.  She is advised to taper her trazodone at this time since it has not been helpful and may contribute memory dysfunction and balance issues at this point.  She is advised to reduce it to  150 mg for the next 2 weeks which would be 1-1/2 pills, then 100 mg at bedtime for another 2 weeks and then reduce it to half a pill, which would be 50 mg at night for now.  We may stop it after that.  She is advised to start melatonin 5 mg strength once she is on the 50 mg of trazodone, she can increase the melatonin by 5 mg up to 15 mg in weekly increments.  She is advised to continue with pramipexole 0.5 mg 3 times daily and levodopa 25-100 mg strength 1 pill 3 times daily for now.  We talked about the importance of healthy lifestyle, being proactive about constipation issues and staying well-hydrated and well rested.  She is advised to try to exercise on a regular basis, she can use her stationary bike.  She is advised to avoid drinking alcohol daily and limit herself to less than 1 drink per day.  She is advised to follow-up in this clinic in 6 months, sooner if needed.  Of note, the patient still drives, they are advised to monitor her driving and she is encouraged to limit herself to local roads and familiar routes only, no nighttime and no highway driving.  I answered all their questions today and the patient and her daughter-in-law were in agreement.   I spent 40 minutes in total face-to-face time and in reviewing records during pre-charting, more than 50% of which was spent in counseling and coordination of care, reviewing test results, reviewing medications and treatment regimen and/or in discussing or reviewing the diagnosis of PD, the prognosis and treatment options. Pertinent laboratory and imaging test results that were available during this visit with the patient were reviewed by me and considered in my medical decision making (see chart for details).

## 2022-01-21 ENCOUNTER — Telehealth: Payer: Self-pay | Admitting: Neurology

## 2022-01-21 NOTE — Telephone Encounter (Signed)
Spoke with the patient.  Tomorrow she will start her half a pill of trazodone at bedtime.  I advised that she can then add one 5 mg melatonin pill each night and take 1 hour before bedtime.  After 1 week she can increase to 2 melatonin pills, and after another week, she can increase to 3 melatonin pills at night which would be 15 mg.  The patient verbalized understanding.  I offered to send this message to her through [facility] again as she had forgotten that her AVS should be in her MyChart. ?

## 2022-01-21 NOTE — Telephone Encounter (Signed)
From last note: " As discussed, we will keep your Parkinson's medications the same.  We will reduce and potentially taper off completely of trazodone.  Please reduce it to 1-1/2 pills at bedtime for the next 2 weeks, then 1 pill at bedtime for 2 weeks, then half a pill at bedtime thereafter.  Once you are on half a pill at bedtime, you can start melatonin 5 mg strength take 1 pill 1 hour before bedtime, it is over-the-counter. You can increase after 1 week to 2 pills, and after another week to up to 3 pills."  ?

## 2022-01-21 NOTE — Telephone Encounter (Signed)
Pt asking how many mg of Melatonin she needs to take. In a week and half switching from Trazodone to Melatonin. Would like a call from the nurse. ?

## 2022-01-28 ENCOUNTER — Other Ambulatory Visit: Payer: Self-pay | Admitting: *Deleted

## 2022-01-28 MED ORDER — TRAZODONE HCL 100 MG PO TABS: 50.0000 mg | ORAL_TABLET | Freq: Every day | ORAL | 1 refills | Status: DC

## 2022-02-15 IMAGING — DX DG PORTABLE PELVIS
1 series · 1 of 1 positions shown · non-contrast
Comparison: 10/12/2019

CLINICAL DATA: Postop left hip replacement

EXAM:
PORTABLE PELVIS 1-2 VIEWS

[pelvis ap]
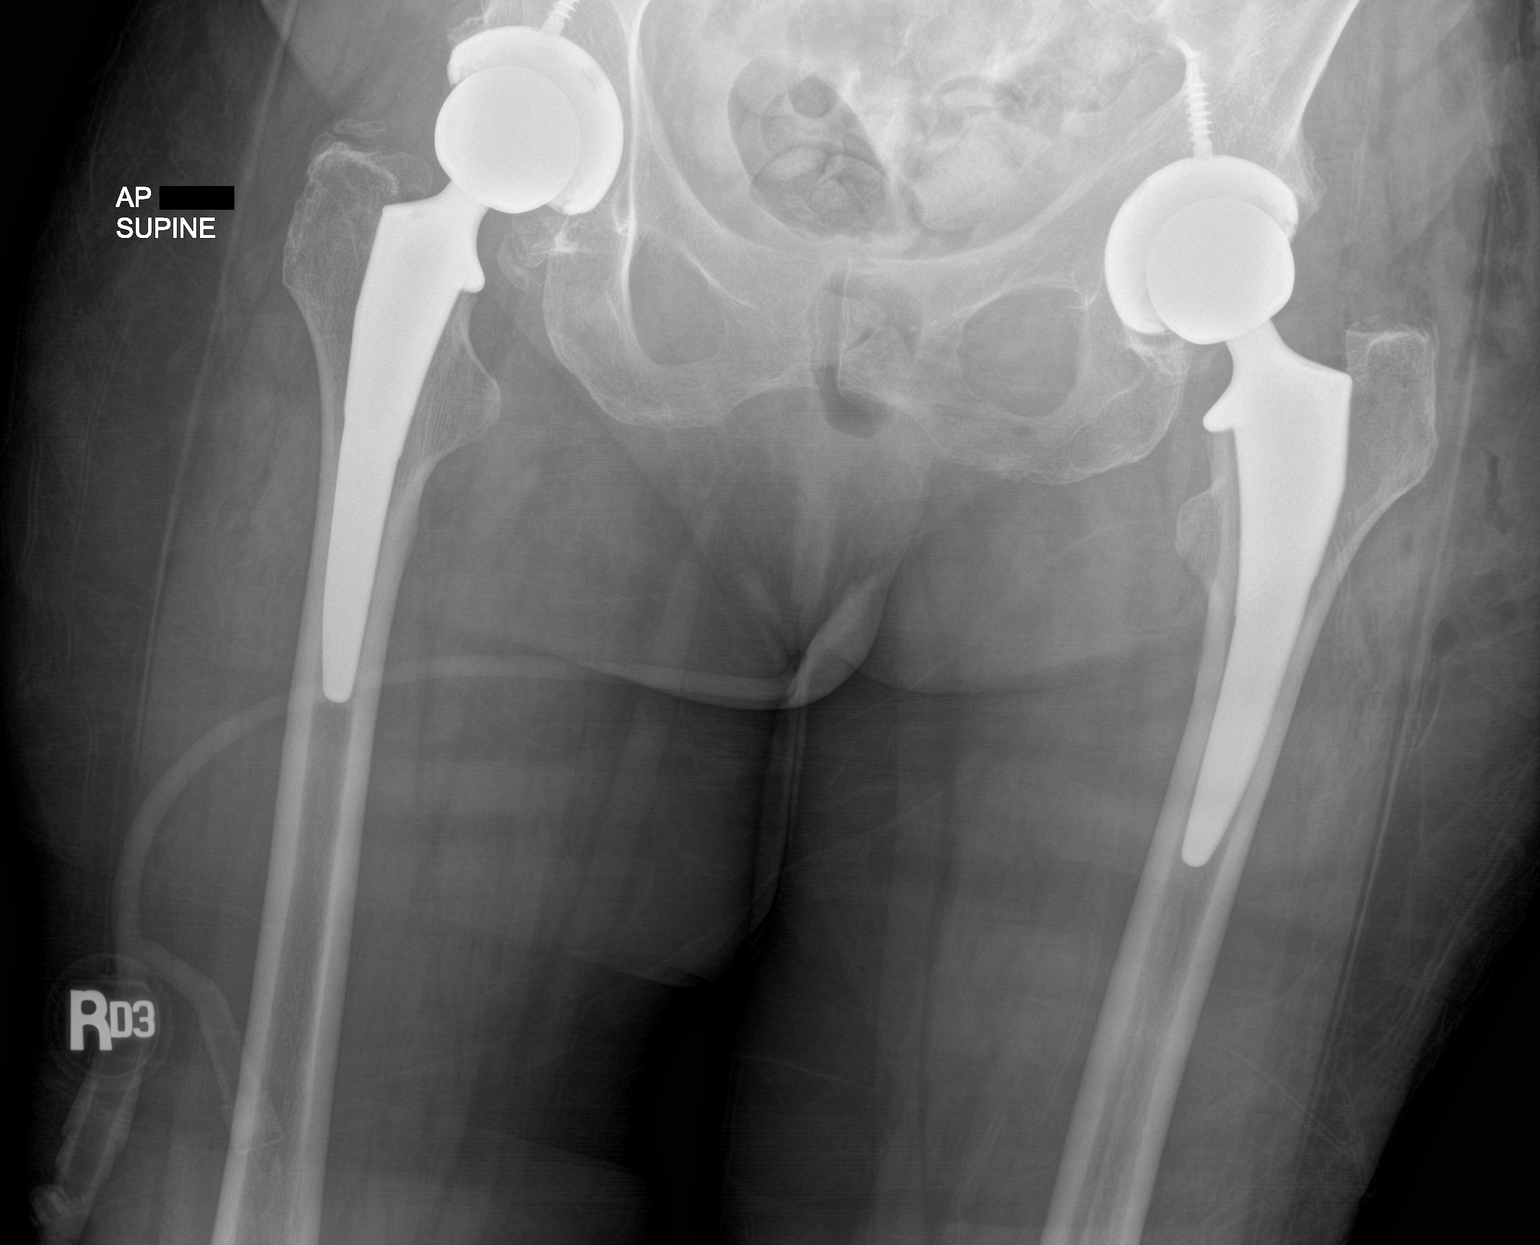

[1 of 1 positions shown; findings below may reference images not displayed]

FINDINGS: Changes of left hip replacement. Normal AP alignment. No hardware
bony complicating feature. Remote right hip replacement changes,
unchanged.
IMPRESSION: New left hip replacement.  No hardware bony complicating feature.

## 2022-06-26 ENCOUNTER — Ambulatory Visit: Payer: Medicare PPO | Admitting: Neurology

## 2022-06-26 ENCOUNTER — Encounter: Payer: Self-pay | Admitting: Neurology

## 2022-06-26 VITALS — BP 130/64 | HR 67 | Ht 64.0 in | Wt 162.6 lb

## 2022-06-26 DIAGNOSIS — G20 Parkinson's disease: Secondary | ICD-10-CM

## 2022-06-26 DIAGNOSIS — G479 Sleep disorder, unspecified: Secondary | ICD-10-CM

## 2022-06-26 DIAGNOSIS — R413 Other amnesia: Secondary | ICD-10-CM

## 2022-06-26 DIAGNOSIS — K5909 Other constipation: Secondary | ICD-10-CM

## 2022-06-26 DIAGNOSIS — R2689 Other abnormalities of gait and mobility: Secondary | ICD-10-CM

## 2022-06-26 NOTE — Progress Notes (Signed)
Subjective:    Patient ID: Shelley Espinoza is a 81 y.o. female.  HPI    Star Age, MD, PhD [facility] [address] [place], [place] [address]  Ms. Shelley Espinoza is an 81 year old right-handed woman with an underlying medical history of hyperlipidemia, migraine headaches, arthritis with status post bilateral hip replacements, status post bilateral cataract extractions, overactive bladder, vertigo, and borderline obesity, who presents for follow-up consultation of her right sided Parkinson's disease.  She is accompanied by her husband today.  I first met her on 12/26/2021, at which time she transitioned from Dr. Tobey Grim care. She had features of right-sided predominant Parkinson's disease, complicated by chronic constipation, sleep disturbance and memory loss.  She was advised to taper off trazodone as it was not helping her sleep.  She was advised to try melatonin.  She was advised to continue with pramipexole 0.5 mg 3 times daily and generic Sinemet 25-100 mg strength 1 pill 3 times daily.  Today, 06/26/2022: She reports feeling stable, feels like her short-term memory is not great but stable.  She has not had any falls, constipation is generally under control, takes MiraLAX every few days, sometimes 2 or 3 days in a row.  She tries to hydrate well.  She does drink coffee in the morning, usually 1 cup.  She has had intermittent lightheadedness particularly when she stands up from a squatted position, or when she bends down to tie her shoes or buckles/straps on her sandals.  Tries to walk in the neighborhood, in the mornings.  She takes her pramipexole and levodopa generally around 8 AM, 2 PM and bedtime which is around 10 PM.  She wakes up around 6, gets out of bed around 8.  She has been taking trazodone 50 mg at night, is willing to get off of it altogether.  She has been taking melatonin 5 mg strength 3 pills at bedtime.  She has no trouble falling  asleep but does have trouble staying asleep.  She drinks alcohol nearly every day, usually 1 glass of wine, sometimes 2.   The patient's allergies, current medications, family history, past medical history, past social history, past surgical history and problem list were reviewed and updated as appropriate.    Previously:  12/26/2021: She reports feeling stable with regards to her Parkinson's but she does not sleep well.  She endorses stress what with her husband's recent surgery and he had a fall as well.  She lives with her husband, her son and daughter-in-law live half a mile away, her daughter lives in [place].  She does not sleep through the night, she feels intermittently dizzy and off balance, has not had any recent falls.  Daughter-in-law reports that she has been found to be more forgetful in the past 3 to 4 months.  She has had more stress.  She has a longstanding history of difficulty sleeping.  She apparently tried high-dose Benadryl in the past and also some melatonin, unclear of the dosing.  She has been on trazodone 200 mg at bedtime per Dr. Jannifer Franklin.  Patient believes that it is not helping and she is suspecting that her balance issues are in part because of the trazodone, she feels that it makes her dizzy.  She tries to hydrate well.  In fact, in the past few days she started a new diet that includes 5 smaller meals and a lot of water.  She drinks alcohol on a regular basis, typically 1 or  2 glasses, up to 3 glasses of wine or liquor but reports that of the diet she is not currently consuming any alcohol.  She has constipation for which she takes MiraLAX nearly daily.  She admits to being forgetful.  She is not able to give very many details about her history and some details are provided by her daughter-in-law.  She has had a right hand tremor dating back to early 2019.  She has no family history of Parkinson's disease.  She drinks caffeine in the form of coffee, 1 cup in the morning.   She quit smoking in the 70s. She previously followed by Dr. Margette Fast and was last seen by him on 06/27/2021.  I reviewed the note and copied the note below for reference.  She has been on pramipexole 0.5 mg 3 times daily and levodopa 25-100 mg strength 1 pill 3 times daily.     06/27/2021 (Dr. Jannifer Franklin): <<Ms. Tischer is an 81 year old right-handed white female with a history of Parkinson's disease.  The patient has done well with her mobility, she continues to have a significant resting tremor with the right upper extremity.  She gives a longstanding history of positional vertigo, she has had to avoid sleeping on the right side of her head for many years because this would induce dizziness.  The patient however began having onset of some dizziness that began about 3 months ago.  The patient would wake up with dizziness that would last up to 2 days.  The dizziness has somewhat worsened over the last 2 months.  The patient has now started having brief episodes of significant dizziness that may occur without any head position change, this may be associated with some graying out of vision.  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  The patient has not had any falls.  She has had to restrict some of her physical activity because of the dizziness.  She now notes that if she looks up or looks down dizziness will ensue.  She has not had any numbness or weakness of the face, arms, legs.  She denies any loss of vision or double vision or slurred speech.  She denies headaches.  She has not had any palpitations of the heart or chest pain.  She comes here for further evaluation. >>    Her Past Medical History Is Significant For: Past Medical History:  Diagnosis Date   Arthritis    Chronic insomnia 12/06/2019   High cholesterol    Overactive bladder    Parkinson's disease ([place]) 11/18/2018   Tremor    right night    Her Past Surgical History Is Significant For: Past  Surgical History:  Procedure Laterality Date   CATARACT EXTRACTION, BILATERAL     age 54   COSMETIC SURGERY     EYE SURGERY     martins nueroma     feet   TOTAL HIP ARTHROPLASTY Right 10/12/2019   Procedure: TOTAL HIP ARTHROPLASTY ANTERIOR APPROACH;  Surgeon: Paralee Cancel, MD;  Location: [facility];  Service: Orthopedics;  Laterality: Right;  70 mins   TOTAL HIP ARTHROPLASTY Left 01/18/2020   Procedure: TOTAL HIP ARTHROPLASTY ANTERIOR APPROACH;  Surgeon: Paralee Cancel, MD;  Location: [facility];  Service: Orthopedics;  Laterality: Left;  59mns   TUBAL LIGATION     VAGINAL DELIVERY      Her Family History Is Significant For: Family History  Problem Relation Age of Onset   Stroke Mother  Hypertension Father    Heart disease Father    Schizophrenia Sister    Parkinsonism Brother     Her Social History Is Significant For: Social History   Socioeconomic History   Marital status: Married    Spouse name: Timmothy Sours   Number of children: 2   Years of education: some college   Highest education level: Not on file  Occupational History   Not on file  Tobacco Use   Smoking status: Former    Types: Cigarettes    Quit date: 1980    Years since quitting: 43.6   Smokeless tobacco: Never   Tobacco comments:    Quit 1980   off and on for 10 years  Vaping Use   Vaping Use: Never used  Substance and Sexual Activity   Alcohol use: Yes    Alcohol/week: 7.0 standard drinks of alcohol    Types: 7 Standard drinks or equivalent per week    Comment: 1 cocktail before dinner-wine or scotch   Drug use: Never   Sexual activity: Not Currently  Other Topics Concern   Not on file  Social History Narrative   Lives with husband   Caffeine use: coffee 1 cup/day   Right handed    Social Determinants of Health   Financial Resource Strain: Not on file  Food Insecurity: Not on file  Transportation Needs: Not on file  Physical Activity: Not on file  Stress: Not on file  Social Connections: Not on file     Her Allergies Are:  No Known Allergies:   Her Current Medications Are:  Outpatient Encounter Medications as of 06/26/2022  Medication Sig   Biotin 7500 MCG TABS Take 7,500 mcg by mouth daily.   Calcium Carb-Cholecalciferol (CALCIUM 600 + D PO) Take 1 tablet by mouth 2 (two) times daily.   carbidopa-levodopa (SINEMET IR) 25-100 MG tablet 1 full tablet 3 times daily   latanoprost (XALATAN) 0.005 % ophthalmic solution Place 1 drop into both eyes at bedtime.   melatonin 3 MG TABS tablet Take 9 mg by mouth at bedtime.   Multiple Vitamins-Minerals (MULTIVITAMIN PO) Take 1 tablet by mouth daily.   MYRBETRIQ 50 MG TB24 tablet Take 50 mg by mouth daily.   Omega-3 Fatty Acids (FISH OIL PO) Take 1 capsule by mouth daily.   polyethylene glycol (MIRALAX / GLYCOLAX) 17 g packet Take 17 g by mouth 2 (two) times daily. (Patient taking differently: Take 17 g by mouth daily.)   pramipexole (MIRAPEX) 0.5 MG tablet Take 1 tablet (0.5 mg total) by mouth 3 (three) times daily.   simvastatin (ZOCOR) 20 MG tablet Take 20 mg by mouth every other day.    traZODone (DESYREL) 100 MG tablet Take 0.5 tablets (50 mg total) by mouth at bedtime.   vitamin B-12 (CYANOCOBALAMIN) 1000 MCG tablet Take 1,000 mcg by mouth daily.   vitamin C (ASCORBIC ACID) 500 MG tablet Take 500 mg by mouth daily.   No facility-administered encounter medications on file as of 06/26/2022.  :   Review of Systems:  Out of a complete 14 point review of systems, all are reviewed and negative with the exception of these symptoms as listed below:   Review of Systems  Neurological:        Pt PD stable.  Feels like memory stable as well.      Objective:  Neurological Exam  Physical Exam Physical Examination:   Vitals:   06/26/22 1040  BP: 130/64  Pulse: 67  General Examination: The patient is a very pleasant 81 y.o. female in no acute distress. She appears well-developed and well-nourished and well groomed.   HEENT:  Normocephalic, atraumatic, pupils are equal, round and reactive to light, extraocular tracking is fairly well-preserved, face is symmetric with mild facial masking, mild nuchal rigidity noted, no lip, neck or jaw tremor.  Airway examination reveals mild mouth dryness, tongue protrudes centrally and palate elevates symmetrically, no carotid bruits.  No significant hypophonia, no dysarthria.   Chest: Clear to auscultation without wheezing, rhonchi or crackles noted.   Heart: S1+S2+0, regular and normal without murmurs, rubs or gallops noted.    Abdomen: Soft, non-tender and non-distended with normal bowel sounds appreciated on auscultation.   Extremities: There is no pitting edema in the distal lower extremities bilaterally.    Skin: Warm and dry without trophic changes noted.    Musculoskeletal: exam reveals no obvious joint deformities.    Neurologically:  Mental status: The patient is awake, alert and able to give appropriate answers.  No dysarthria, mood is normal and affect is normal.  Thought process is linear.      06/26/2022   11:21 AM 06/03/2019   11:20 AM  MMSE - Mini Mental State Exam  Orientation to time 5 5  Orientation to Place 4 4  Registration 3 3  Attention/ Calculation 5 5  Recall 2 2  Language- name 2 objects 2 2  Language- repeat 1 1  Language- follow 3 step command 3 3  Language- read & follow direction 1 1  Write a sentence 1 1  Copy design 1 0  Total score 28 27    On 06/26/2022: CDT: 4/4, AFT: 9/min.  Cranial nerves II - XII are as described above under HEENT exam.  Motor exam: Normal bulk, global strength good, mild increase in tone in the right wrist, she has a fairly consistent right upper extremity resting tremor, no other tremors noted, no significant postural tremor.  Fine motor skills are mildly impaired on the right upper extremity, and right lower extremity with foot taps, otherwise fairly normal on the left side.  Romberg is not tested due to safety  concerns.   Cerebellar testing: No dysmetria or intention tremor. There is no truncal or gait ataxia.  Sensory exam: intact to light touch in the upper and lower extremities.  Gait, station and balance: She stands without difficulty eyes any lightheadedness or vertiginous symptoms upon standing.  She has a fairly age-appropriate posture.  She walks with decreased arm swing on the right.  She turns with mild insecurity.  No walking aid.    Assessment and Plan:  In summary, Sallee Hogrefe is a very pleasant 81 year old female with an underlying medical history of hyperlipidemia, migraine headaches, arthritis with status post bilateral hip replacements, status post bilateral cataract extractions, overactive bladder, vertigo, and borderline obesity, who presents for follow-up consultation of her right sided Parkinson's disease, complicated by chronic constipation, chronic sleep disturbance, and some memory loss.  We will continue to monitor her symptoms and examination, MMSE stable today.  She can continue with Sinemet and pramipexole 1 pill 3 times daily at the current times.  She is reminded not to take her levodopa with meals.  She can maintain her melatonin at 15 mg at bedtime and reduce her trazodone to 25 mg at bedtime for 1 week and then stop it.  She is advised to stay active mentally and physically, stay well-hydrated and well rested.  She is advised  to continue to be proactive about constipation issues, stand up slowly especially if she has to bend down or crouch.  She is advised to FU routinely to see one of our nurse practitioners in 6 months, sooner if needed.  She did not need a prescription refill today.  I answered all the questions today and the patient and her husband were in agreement. I spent 40 minutes in total face-to-face time and in reviewing records during pre-charting, more than 50% of which was spent in counseling and coordination of care, reviewing test results, reviewing medications  and treatment regimen and/or in discussing or reviewing the diagnosis of PD, short-term memory loss, the prognosis and treatment options. Pertinent laboratory and imaging test results that were available during this visit with the patient were reviewed by me and considered in my medical decision making (see chart for details).

## 2022-06-26 NOTE — Patient Instructions (Addendum)
It was nice to see you again today. You look well and exam is stable.  You can continue taking your levodopa and pramipexole 3 times a day, around 8 AM, 2 PM and around 10 PM daily.  Try not to take your pills with mealtimes as the levodopa does not get absorbed well enough with meals/food in the stomach.  As discussed, we will try to phase out of trazodone, reduce it to 1/4 pill per night for the next week and then stop it altogether.  You can continue with melatonin 15 mg at bedtime.  Your dizziness upon standing may be related to blood pressure dysregulation, Parkinson's patients can have sudden drop in blood pressure especially if you stand up too quickly especially from a squatted or bent position.  Please stand up slowly and get your bearings, may have to hold onto something.  Try to hydrate well with water and watch for issues with constipation, you can take MiraLAX every day if needed.

## 2022-07-09 ENCOUNTER — Telehealth: Payer: Self-pay | Admitting: Neurology

## 2022-07-09 NOTE — Telephone Encounter (Signed)
Pt said have not slept in 3 days. Could Dr. Frances Furbish sent in something to help with sleep. Would like a call from the nurse.

## 2022-07-09 NOTE — Telephone Encounter (Signed)
I called pt and relayed that since tolerated melatonin that increasing to 20mg  po qhs and also avoid drinking alcohol at this time were Dr. recommendation.  She appreciated call back.

## 2022-07-09 NOTE — Telephone Encounter (Signed)
If she has been able to tolerate the melatonin, she can increase it to 20 mg at bedtime, I would recommend she avoid drinking any alcohol at this time.  Please call patient back.

## 2022-10-09 ENCOUNTER — Other Ambulatory Visit: Payer: Self-pay | Admitting: Neurology

## 2022-10-09 ENCOUNTER — Telehealth: Payer: Self-pay | Admitting: Neurology

## 2022-10-09 NOTE — Telephone Encounter (Signed)
Spoke to patient . Pt states increased tremors  in right hand started a couple of weeks ago Pt states [facility] Pt states she was given prescriptions for Doxcyline hyclate 100mg  2x daily for 10 days Mucinex  DM every 12 hours and Promethazine DM solution 5mg  every 4 to 6 hours . Pt states after taking these medications she has dizziness,stomach upset . Pt states increased tremors are causing her not to sleep at night . Advised patient since these are new symptoms she needs to be seen at urgent care or PCP ASAP . Informed patient she can also go to ER to be evaluated . Made a sooner appointment with Dr. next Wednesday  10/16/2022 @ 8:15 am . Patient states if she goes to urgent care its a 3 hour wait time . Informed patient since theses are new symptoms she needs to be evaluated ASAP . Pt expressed understanding and thanked me for calling her back . Will forward to Dr Sunday to make her aware .

## 2022-10-09 NOTE — Telephone Encounter (Signed)
Pt is calling. Stated she need to talk to nurse about her Parkinson's. Said her hands are shaking really bad and is really dizzy. Pt is requesting a call back from nurse.

## 2022-10-09 NOTE — Telephone Encounter (Signed)
As already mentioned to the patient and detailed in your phone conversation, patient needs to follow-up with PCP as soon as possible.  If she has fevers or chills or severe headaches, or shortness of breath or chest pain, she needs to call 911 or go to the emergency room.  Parkinson's symptoms can get worse when there is an underlying infection or acute illness.

## 2022-10-10 NOTE — Telephone Encounter (Signed)
Spoke to patient gave Dr.Athars recommendation . Pt states she called her PCP and  told  her to drink more electrolytes  . Reminded patient of upcoming appointment next week . Pt thanked me for calling her back

## 2022-10-16 ENCOUNTER — Ambulatory Visit: Payer: Medicare PPO | Admitting: Neurology

## 2022-10-16 ENCOUNTER — Encounter: Payer: Self-pay | Admitting: Neurology

## 2022-10-16 VITALS — BP 116/62 | HR 83 | Ht 64.0 in | Wt 167.2 lb

## 2022-10-16 DIAGNOSIS — R052 Subacute cough: Secondary | ICD-10-CM | POA: Diagnosis not present

## 2022-10-16 DIAGNOSIS — J069 Acute upper respiratory infection, unspecified: Secondary | ICD-10-CM

## 2022-10-16 DIAGNOSIS — G20A2 Parkinson's disease without dyskinesia, with fluctuations: Secondary | ICD-10-CM

## 2022-10-16 NOTE — Progress Notes (Signed)
Subjective:    Patient ID: Shelley Espinoza is a 81 y.o. female.  HPI    Interim history:   Shelley Espinoza is an 81 year old right-handed woman with an underlying medical history of hyperlipidemia, migraine headaches, arthritis with status post bilateral hip replacements, status post bilateral cataract extractions, overactive bladder, vertigo, and borderline obesity, who presents for follow-up consultation of her right sided Parkinson's disease.  She is accompanied by her daughter-in-law today and presents for a sooner than scheduled appointment.  I last saw her on 06/26/2022, at which time she was advised to taper off trazodone.  She was advised to continue with pramipexole and levodopa 3 times a day. She was advised to refrain from drinking alcohol daily and utilize melatonin as needed for sleep.  She called in early December 2023 reporting that she was more tremulous and felt really dizzy.  She had been to urgent care and was prescribed an antibiotic.  She was advised to follow-up with PCP.  She called back reporting that she called her PCP office and was advised to drink more electrolytes but was not seen.  Today, 10/16/2022: She reports that she had a respiratory infection right after Thanksgiving.  She had to go to urgent care.  She reports that she was prescribed multiple medications including doxycycline for 10 days.  She was on Mucinex DM and Promethazine DM and also took Delsym.  After reading and realizing that these medicines are not recommended for Parkinson's patients she stopped taking these about a week or so ago.  She has noticed a worsening tremor on the right side, she feels that her tremor used to be more intermittent and she could even calm it down at times but now it seems to be much more consistent.  She takes her Sinemet and pramipexole 3 times a day, usually around 8 AM, 2 PM and 9 PM daily.  She still does not sleep very well, takes melatonin 20 mg at bedtime.  She is  wondering if she can take anything else.  She has been improving with regards to the cough.  She is trying to hydrate well and also added some electrolyte water once a day.  She limits her caffeine to 1 cup of coffee in the morning and has scaled back on her wine intake, does not drink every day any longer and in fact has not had any alcohol since she got sick.  She felt dizzy.  She reports that her primary care prescribed meclizine which made her even more dizzy.  She has stopped taking it.  She has had intermittent blurry vision.  She has a history of glaucoma and saw her ophthalmologist.  She had laser surgery a couple months ago and does not take any glaucoma drops any longer.  Her ophthalmologist did not think blurry vision was related to the glaucoma.  She denies any double vision or loss of vision.  The patient's allergies, current medications, family history, past medical history, past social history, past surgical history and problem list were reviewed and updated as appropriate.    Previously:   I first met her on 12/26/2021, at which time she transitioned from Dr. Tobey Grim care. She had features of right-sided predominant Parkinson's disease, complicated by chronic constipation, sleep disturbance and memory loss.  She was advised to taper off trazodone as it was not helping her sleep.  She was advised to try melatonin.  She was advised to continue with pramipexole 0.5 mg 3 times daily and generic Sinemet  25-100 mg strength 1 pill 3 times daily.   12/26/2021: She reports feeling stable with regards to her Parkinson's but she does not sleep well.  She endorses stress what with her husband's recent surgery and he had a fall as well.  She lives with her husband, her son and daughter-in-law live half a mile away, her daughter lives in [place].  She does not sleep through the night, she feels intermittently dizzy and off balance, has not had any recent falls.  Daughter-in-law reports that she has  been found to be more forgetful in the past 3 to 4 months.  She has had more stress.  She has a longstanding history of difficulty sleeping.  She apparently tried high-dose Benadryl in the past and also some melatonin, unclear of the dosing.  She has been on trazodone 200 mg at bedtime per Dr. Jannifer Franklin.  Patient believes that it is not helping and she is suspecting that her balance issues are in part because of the trazodone, she feels that it makes her dizzy.  She tries to hydrate well.  In fact, in the past few days she started a new diet that includes 5 smaller meals and a lot of water.  She drinks alcohol on a regular basis, typically 1 or 2 glasses, up to 3 glasses of wine or liquor but reports that of the diet she is not currently consuming any alcohol.  She has constipation for which she takes MiraLAX nearly daily.  She admits to being forgetful.  She is not able to give very many details about her history and some details are provided by her daughter-in-law.  She has had a right hand tremor dating back to early 2019.  She has no family history of Parkinson's disease.  She drinks caffeine in the form of coffee, 1 cup in the morning.  She quit smoking in the 70s. She previously followed by Dr. Margette Fast and was last seen by him on 06/27/2021.  I reviewed the note and copied the note below for reference.  She has been on pramipexole 0.5 mg 3 times daily and levodopa 25-100 mg strength 1 pill 3 times daily.     06/27/2021 (Dr. Jannifer Franklin): <<[name] is an 81 year old right-handed white female with a history of Parkinson's disease.  The patient has done well with her mobility, she continues to have a significant resting tremor with the right upper extremity.  She gives a longstanding history of positional vertigo, she has had to avoid sleeping on the right side of her head for many years because this would induce dizziness.  The patient however began having onset of some dizziness that began about 3 months  ago.  The patient would wake up with dizziness that would last up to 2 days.  The dizziness has somewhat worsened over the last 2 months.  The patient has now started having brief episodes of significant dizziness that may occur without any head position change, this may be associated with some graying out of vision.  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  The patient has not had any falls.  She has had to restrict some of her physical activity because of the dizziness.  She now notes that if she looks up or looks down dizziness will ensue.  She has not had any numbness or weakness of the face, arms, legs.  She denies any loss of vision or double vision or slurred speech.  She denies  headaches.  She has not had any palpitations of the heart or chest pain.  She comes here for further evaluation. >>     Her Past Medical History Is Significant For: Past Medical History:  Diagnosis Date   Arthritis    Chronic insomnia 12/06/2019   High cholesterol    Overactive bladder    Parkinson's disease 11/18/2018   Tremor    right night    Her Past Surgical History Is Significant For: Past Surgical History:  Procedure Laterality Date   CATARACT EXTRACTION, BILATERAL     age 15   COSMETIC SURGERY     EYE SURGERY     martins nueroma     feet   TOTAL HIP ARTHROPLASTY Right 10/12/2019   Procedure: TOTAL HIP ARTHROPLASTY ANTERIOR APPROACH;  Surgeon: Paralee Cancel, MD;  Location: [facility];  Service: Orthopedics;  Laterality: Right;  70 mins   TOTAL HIP ARTHROPLASTY Left 01/18/2020   Procedure: TOTAL HIP ARTHROPLASTY ANTERIOR APPROACH;  Surgeon: Paralee Cancel, MD;  Location: [facility];  Service: Orthopedics;  Laterality: Left;  81mns   TUBAL LIGATION     VAGINAL DELIVERY      Her Family History Is Significant For: Family History  Problem Relation Age of Onset   Stroke Mother    Hypertension Father    Heart disease Father    Schizophrenia Sister    Parkinsonism  Brother     Her Social History Is Significant For: Social History   Socioeconomic History   Marital status: Married    Spouse name: DTimmothy Sours  Number of children: 2   Years of education: some college   Highest education level: Not on file  Occupational History   Not on file  Tobacco Use   Smoking status: Former    Types: Cigarettes    Quit date: 1980    Years since quitting: 43.9   Smokeless tobacco: Never   Tobacco comments:    Quit 1980   off and on for 10 years  Vaping Use   Vaping Use: Never used  Substance and Sexual Activity   Alcohol use: Yes    Alcohol/week: 7.0 standard drinks of alcohol    Types: 7 Standard drinks or equivalent per week    Comment: 1 cocktail before dinner-wine or scotch   Drug use: Never   Sexual activity: Not Currently  Other Topics Concern   Not on file  Social History Narrative   Lives with husband   Caffeine use: coffee 1 cup/day   Right handed    Social Determinants of Health   Financial Resource Strain: Not on file  Food Insecurity: Not on file  Transportation Needs: Not on file  Physical Activity: Not on file  Stress: Not on file  Social Connections: Not on file    Her Allergies Are:  No Known Allergies:   Her Current Medications Are:  Outpatient Encounter Medications as of 10/16/2022  Medication Sig   Biotin 5000 MCG CAPS Take 1 capsule by mouth daily at 6 (six) AM.   Calcium Carb-Cholecalciferol (CALCIUM 600 + D PO) Take 1 tablet by mouth 2 (two) times daily.   carbidopa-levodopa (SINEMET IR) 25-100 MG tablet 1 full tablet 3 times daily   Cholecalciferol (VITAMIN D) 50 MCG (2000 UT) CAPS Take 1 capsule by mouth daily.   Melatonin 10 MG CAPS Take 20 mg by mouth at bedtime.   Multiple Vitamins-Minerals (MULTIVITAMIN PO) Take 1 tablet by mouth daily.   MYRBETRIQ 50 MG TB24 tablet Take  50 mg by mouth daily.   Omega-3 Fatty Acids (FISH OIL PO) Take 1 capsule by mouth daily.   pramipexole (MIRAPEX) 0.5 MG tablet TAKE 1 TABLET  THREE TIMES DAILY   simvastatin (ZOCOR) 20 MG tablet Take 20 mg by mouth every other day.    vitamin B-12 (CYANOCOBALAMIN) 1000 MCG tablet Take 1,000 mcg by mouth daily.   vitamin C (ASCORBIC ACID) 500 MG tablet Take 500 mg by mouth daily.   polyethylene glycol (MIRALAX / GLYCOLAX) 17 g packet Take 17 g by mouth 2 (two) times daily. (Patient taking differently: Take 17 g by mouth daily.)   [DISCONTINUED] Biotin 7500 MCG TABS Take 7,500 mcg by mouth daily.   [DISCONTINUED] latanoprost (XALATAN) 0.005 % ophthalmic solution Place 1 drop into both eyes at bedtime.   [DISCONTINUED] melatonin 3 MG TABS tablet Take 9 mg by mouth at bedtime.   No facility-administered encounter medications on file as of 10/16/2022.  :  Review of Systems:  Out of a complete 14 point review of systems, all are reviewed and negative with the exception of these symptoms as listed below:  Review of Systems  Neurological:        Increased tremor in R hand.     Objective:  Neurological Exam  Physical Exam Physical Examination:   Vitals:   10/16/22 0815  BP: 116/62  Pulse: 83    General Examination: The patient is a very pleasant 81 y.o. female in no acute distress. She appears well-developed and well-nourished and well groomed.   HEENT: Normocephalic, atraumatic, pupils are equal, round and reactive to light, extraocular tracking is fairly well-preserved, face is symmetric with mild facial masking, mild nuchal rigidity noted, no lip, neck or jaw tremor.  Airway examination reveals mild mouth dryness, tongue protrudes centrally and palate elevates symmetrically, no carotid bruits.  No significant hypophonia, no dysarthria.   Chest: Clear to auscultation without wheezing, rhonchi or crackles noted.   Heart: S1+S2+0, regular and normal without murmurs, rubs or gallops noted.    Abdomen: Soft, non-tender and non-distended with normal bowel sounds appreciated on auscultation.   Extremities: There is no pitting  edema in the distal lower extremities bilaterally.    Skin: Warm and dry without trophic changes noted.    Musculoskeletal: exam reveals no obvious joint deformities.    Neurologically:  Mental status: The patient is awake, alert and able to give appropriate answers.  No dysarthria, mood is normal and affect is normal.  Thought process is linear.        06/26/2022   11:21 AM 06/03/2019   11:20 AM  MMSE - Mini Mental State Exam  Orientation to time 5 5  Orientation to Place 4 4  Registration 3 3  Attention/ Calculation 5 5  Recall 2 2  Language- name 2 objects 2 2  Language- repeat 1 1  Language- follow 3 step command 3 3  Language- read & follow direction 1 1  Write a sentence 1 1  Copy design 1 0  Total score 28 27     On 06/26/2022: CDT: 4/4, AFT: 9/min.   Cranial nerves II - XII are as described above under HEENT exam.  Motor exam: Normal bulk, global strength good, mild increase in tone in the right wrist, she has a fairly consistent right upper extremity resting tremor, no other tremors noted, no significant postural tremor.  Tremor in her right hand is more pronounced, in the moderate range.  Fine motor skills are mildly impaired  on the right upper extremity, and right lower extremity with foot agility but fairly normal on the left side.  Romberg is not tested due to safety concerns.   Cerebellar testing: No dysmetria or intention tremor. There is no truncal or gait ataxia.  Sensory exam: intact to light touch in the upper and lower extremities.  Gait, station and balance: She stands without difficulty and walks without a walking aid.     Assessment and Plan:  In summary, Shelley Espinoza is a very pleasant 81 year old female with an underlying medical history of hyperlipidemia, migraine headaches, arthritis with status post bilateral hip replacements, status post bilateral cataract extractions, overactive bladder, vertigo, and borderline obesity, who presents for follow-up  consultation of her right sided Parkinson's disease, complicated by chronic constipation, chronic sleep disturbance, and some memory loss, particularly forgetfulness.  She has had exacerbation of her tremor since she had an upper respiratory infection in late November 2023.  We talked about challenges of Parkinson's disease and setbacks at length today.  She is advised not to take any cough medicine or cold medicine that has at cough suppressant or dextromethorphan or DM to the name.  She is advised to stay well-hydrated and well rested, she can add some valerian root supplement or chamomile or even magnesium at night, continue with melatonin.  She has been off of the trazodone.  She is advised to increase her Sinemet to 1 pill 4 times a day, at 8, 12, 4 PM and 8 PM daily and continue with pramipexole 3 times a day at 8 AM, 12 and 8 PM daily.  We talked about the importance of maintaining a healthy lifestyle and potential side effects with Parkinson's medications.  I would like for her to avoid taking meclizine as it can make her drowsy, more dizzy, and affect her balance.  She is advised to take meclizine only if she truly has a vertigo attack.  We had an extended discussion today, I answered all their questions today and the patient and her daughter-in-law were in agreement with our plan.  She is advised to follow-up to see one of our nurse practitioners routinely in 4 to 6 months.  I spent 40 minutes in total face-to-face time and in reviewing records during pre-charting, more than 50% of which was spent in counseling and coordination of care, reviewing test results, reviewing medications and treatment regimen and/or in discussing or reviewing the diagnosis of PD, the prognosis and treatment options. Pertinent laboratory and imaging test results that were available during this visit with the patient were reviewed by me and considered in my medical decision making (see chart for details).

## 2022-10-16 NOTE — Patient Instructions (Addendum)
It was nice to see you again today.  I am glad to hear that you are on the mend with regards to your upper respiratory infection.  For future reference, please avoid any cough medicine with dextromethorphan or DM in the name.  I would avoid meclizine which can make you off balance and more dizzy, unless you truly have a vertigo attack.  Please try to hydrate well with water, stand up slowly and get your bearings first, avoid alcohol daily and limit your caffeine as you are.  We will increase your carbidopa-levodopa to 1 pill 4 times a day at 8 AM, 12, 4 PM and 8 PM daily.  You can take your pramipexole 3 times a day at 8 AM, 12, and 8 PM daily.    For sleep, you can continue to take melatonin 20 mg at bedtime.  You can look for an herbal supplement called valerian root, you can add this as well and it should be safe to take, some people also take a little bit of magnesium at bedtime which can help.  Chamomile is also known for its calming effects.  You can use this as a tea at bedtime.

## 2022-10-22 ENCOUNTER — Other Ambulatory Visit: Payer: Self-pay | Admitting: Neurology

## 2023-01-13 ENCOUNTER — Ambulatory Visit: Payer: Medicare PPO | Admitting: Adult Health

## 2023-03-05 ENCOUNTER — Telehealth: Payer: Self-pay | Admitting: Neurology

## 2023-03-05 NOTE — Telephone Encounter (Signed)
I recommend, she try one thing at a time. There are OTC sleep aids, that have a combination of supplements in them.

## 2023-03-05 NOTE — Telephone Encounter (Signed)
Pt stated she is having problems sleeping. Stated she only slept five hours last night. Pt requesting a call back from nurse to discuss.

## 2023-03-05 NOTE — Telephone Encounter (Signed)
I called pt I relayed that per Dr. Teofilo Pod last note she said she could take the melatonin 20mg  po qhs, but can add valerian root , also magnesium, chamomile tea.  She was grateful for the information .  She will try .  She seems to feel like her sleep is worse,  she wakes twice nightly for bathroom visits. She will try these and see if this will help.  Take one at a time, or can she take all at the same time? Your recommendations?

## 2023-03-06 NOTE — Telephone Encounter (Signed)
I called and relayed that per Dr. Frances Furbish that she would recommend to use one otc supp at a time to see if would help her sleep.  (May continue with the melatonin).   She appreciated call back.  I also stated that there are otc sleep aids (that have a combination of supplement in them too.

## 2023-06-12 ENCOUNTER — Ambulatory Visit: Payer: Medicare PPO | Admitting: Adult Health

## 2023-07-22 NOTE — Progress Notes (Unsigned)
PATIENT: Shelley Espinoza DOB: 1941-02-21  REASON FOR VISIT: follow up HISTORY FROM: patient PRIMARY NEUROLOGIST: Dr. Frances Furbish  Chief Complaint  Patient presents with   Room 20    Pt is here Alone. Pt states that things have been fine since her last appointment with her Parkinson's. Pt states that she still has tremors in her right hand that has become more noticeable lately.  Pt states that she want to ask about taking miralax and does she needs to stop some of her vitamins.      HISTORY OF PRESENT ILLNESS: Today 07/22/23  Shelley Espinoza is a 82 y.o. female who has been followed in this office for Parkinson's disease. Returns today for follow-up. At the lastr visit Sinemet was increased to QID. But felt that she didn't need it so she went back to TID.  Continued Mirapex TID.  Denies any changes with gait or balance. Able to walk a mile a day. Reports feeling off balance at times. No falls. No trouble chewing or swallowing food.  Denies vivid dreams but wakes up frequency to go to the bathroom. Taking valerian root and melatonin-feels this has been helpful for her sleep.  Continues to notice a prominent tremor in the right hand.  HISTORY 10/16/2022: She reports that she had a respiratory infection right after Thanksgiving.  She had to go to urgent care.  She reports that she was prescribed multiple medications including doxycycline for 10 days.  She was on Mucinex DM and Promethazine DM and also took Delsym.  After reading and realizing that these medicines are not recommended for Parkinson's patients she stopped taking these about a week or so ago.  She has noticed a worsening tremor on the right side, she feels that her tremor used to be more intermittent and she could even calm it down at times but now it seems to be much more consistent.  She takes her Sinemet and pramipexole 3 times a day, usually around 8 AM, 2 PM and 9 PM daily.  She still does not sleep very well, takes melatonin 20 mg at  bedtime.  She is wondering if she can take anything else.  She has been improving with regards to the cough.  She is trying to hydrate well and also added some electrolyte water once a day.  She limits her caffeine to 1 cup of coffee in the morning and has scaled back on her wine intake, does not drink every day any longer and in fact has not had any alcohol since she got sick.  She felt dizzy.  She reports that her primary care prescribed meclizine which made her even more dizzy.  She has stopped taking it.  She has had intermittent blurry vision.  She has a history of glaucoma and saw her ophthalmologist.  She had laser surgery a couple months ago and does not take any glaucoma drops any longer.  Her ophthalmologist did not think blurry vision was related to the glaucoma.  She denies any double vision or loss of vision.   The patient's allergies, current medications, family history, past medical history, past social history, past surgical history and problem list were reviewed and updated as appropriate.     REVIEW OF SYSTEMS: Out of a complete 14 system review of symptoms, the patient complains only of the follow ing symptoms, and all other reviewed systems are negative.  ALLERGIES: No Known Allergies  HOME MEDICATIONS: Outpatient Medications Prior to Visit  Medication Sig Dispense Refill  Biotin 5000 MCG CAPS Take 1 capsule by mouth daily at 6 (six) AM.     Calcium Carb-Cholecalciferol (CALCIUM 600 + D PO) Take 1 tablet by mouth 2 (two) times daily.     carbidopa-levodopa (SINEMET IR) 25-100 MG tablet TAKE 1 TABLET THREE TIMES DAILY 270 tablet 3   Cholecalciferol (VITAMIN D) 50 MCG (2000 UT) CAPS Take 1 capsule by mouth daily.     Melatonin 10 MG CAPS Take 20 mg by mouth at bedtime.     Multiple Vitamins-Minerals (MULTIVITAMIN PO) Take 1 tablet by mouth daily.     MYRBETRIQ 50 MG TB24 tablet Take 50 mg by mouth daily.     Omega-3 Fatty Acids (FISH OIL PO) Take 1 capsule by mouth daily.      polyethylene glycol (MIRALAX / GLYCOLAX) 17 g packet Take 17 g by mouth 2 (two) times daily. (Patient taking differently: Take 17 g by mouth daily.) 28 packet 0   pramipexole (MIRAPEX) 0.5 MG tablet TAKE 1 TABLET THREE TIMES DAILY 270 tablet 3   simvastatin (ZOCOR) 20 MG tablet Take 20 mg by mouth every other day.   5   vitamin B-12 (CYANOCOBALAMIN) 1000 MCG tablet Take 1,000 mcg by mouth daily.     vitamin C (ASCORBIC ACID) 500 MG tablet Take 500 mg by mouth daily.     No facility-administered medications prior to visit.    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   Procedure: TOTAL HIP ARTHROPLASTY ANTERIOR APPROACH;  Surgeon: Durene Romans, MD;  Location: [facility];  Service: Orthopedics;  Laterality: Right;  70 mins   TOTAL HIP ARTHROPLASTY Left 01/18/2020   Procedure: TOTAL HIP ARTHROPLASTY ANTERIOR APPROACH;  Surgeon: Durene Romans, MD;  Location: [facility];  Service: Orthopedics;  Laterality: Left;    TUBAL LIGATION     VAGINAL DELIVERY      FAMILY HISTORY: Family History  Problem Relation Age of Onset   Stroke Mother    Hypertension Father    Heart disease Father    Schizophrenia Sister    Parkinsonism Brother     SOCIAL HISTORY: Social History   Socioeconomic History   Marital status: Married    Spouse name: Roe Coombs   Number of children: 2   Years of education: some college   Highest education level: Not on file  Occupational History   Not on file  Tobacco Use   Smoking status: Former    Current packs/day: 0.00    Types: Cigarettes    Quit date: 1980    Years since  quitting: 44.7   Smokeless tobacco: Never   Tobacco comments:    Quit 1980   off and on for 10 years  Vaping Use   Vaping status: Never Used  Substance and Sexual Activity   Alcohol use: Yes    Alcohol/week: 7.0 standard drinks of alcohol    Types: 7 Standard drinks or equivalent per week    Comment: 1 cocktail before dinner-wine or scotch  Drug use: Never   Sexual activity: Not Currently  Other Topics Concern   Not on file  Social History Narrative   Lives with husband   Caffeine use: coffee 1 cup/day   Right handed    Social Determinants of Health   Financial Resource Strain: Not on file  Food Insecurity: Not on file  Transportation Needs: Not on file  Physical Activity: Not on file  Stress: Not on file  Social Connections: Not on file  Intimate Partner Violence: Not on file      PHYSICAL EXAM  Vitals:   07/23/23 0952  BP: 131/73  Pulse: 65  Weight: 171 lb 8 oz (77.8 kg)  Height: 5\' 4"  (1.626 m)   Body mass index is 29.44 kg/m.     07/23/2023    9:56 AM 06/26/2022   11:21 AM 06/03/2019   11:20 AM  MMSE - Mini Mental State Exam  Orientation to time 4 5 5   Orientation to Place 4 4 4   Registration 3 3 3   Attention/ Calculation 1 5 5   Recall 2 2 2   Language- name 2 objects 2 2 2   Language- repeat 1 1 1   Language- follow 3 step command 3 3 3   Language- read & follow direction 1 1 1   Write a sentence 1 1 1   Copy design 1 1 0  Total score 23 28 27      Generalized: Well developed, in no acute distress   Neurological examination  Mentation: Alert oriented to time, place, history taking. Follows all commands speech and language fluent Cranial nerve II-XII: Pupils were equal round reactive to light. Extraocular movements were full, visual field were full on confrontational test. Facial sensation and strength were normal. Uvula tongue midline. Head turning and shoulder shrug  were normal and symmetric. Motor: The motor testing reveals 5 over 5 strength of  all 4 extremities. Good symmetric motor tone is noted throughout.  Finger/toe taps mildly impaired bilaterally.  Rapid alternating movement moderately impaired.  Resting tremor noted in the right upper extremity Sensory: Sensory testing is intact to soft touch on all 4 extremities. No evidence of extinction is noted.  Coordination: Cerebellar testing reveals good finger-nose-finger and heel-to-shin bilaterally.  Gait and station: Patient is able to stand without assistance.  Good stride and arm swing.  1 step with turns. Reflexes: Deep tendon reflexes are symmetric and normal bilaterally.   DIAGNOSTIC DATA (LABS, IMAGING, TESTING) - I reviewed patient records, labs, notes, testing and imaging myself where available.  Lab Results  Component Value Date   WBC 10.3 01/19/2020   HGB 11.0 (L) 01/19/2020   HCT 34.5 (L) 01/19/2020   MCV 102.7 (H) 01/19/2020   PLT 196 01/19/2020      Component Value Date/Time   NA 138 01/19/2020 0415   K 4.5 01/19/2020 0415   CL 107 01/19/2020 0415   CO2 25 01/19/2020 0415   GLUCOSE 160 (H) 01/19/2020 0415   BUN 15 01/19/2020 0415   CREATININE 0.73 01/19/2020 0415   CREATININE 0.77 08/16/2019 1157   CALCIUM 8.7 (L) 01/19/2020 0415   PROT 6.5 03/02/2019 1140   AST 30 03/02/2019 1140   ALT 44 (H) 03/02/2019 1140   BILITOT 0.6 03/02/2019 1140   GFRNONAA >60 01/19/2020 0415   GFRNONAA 73 08/16/2019 1157   GFRAA >60 01/19/2020 0415   GFRAA 85 08/16/2019 1157      ASSESSMENT AND PLAN 82 y.o. year old female  has a past medical history of Arthritis, Chronic  insomnia (12/06/2019), High cholesterol, Overactive bladder, Parkinson's disease (11/18/2018), and Tremor. here with:  1.  Parkinson's disease  -Overall she has remained relatively stable. -Encouraged her to continue Sinemet 25-100 mg 3 times a day.  Did advise that if she wanted to do a trial of 4 times a day spacing it out by every 4 hours she can see if this offers any additional benefit for her  tremor. -Continue Mirapex 0.5 mg 3 times a day -Encouraged her to stay well-hydrated and continue with exercise.  We did advise her to use caution if she is feeling off balance. -Follow-up in 6 months or sooner if needed     Butch Penny, MSN, NP-C 07/22/2023, 2:24 PM [facility] [address], [address] [place], [place] [address] [phone]

## 2023-07-23 ENCOUNTER — Encounter: Payer: Self-pay | Admitting: Adult Health

## 2023-07-23 ENCOUNTER — Ambulatory Visit: Payer: Medicare PPO | Admitting: Adult Health

## 2023-07-23 VITALS — BP 131/73 | HR 65 | Ht 64.0 in | Wt 171.5 lb

## 2023-07-23 DIAGNOSIS — G20A2 Parkinson's disease without dyskinesia, with fluctuations: Secondary | ICD-10-CM

## 2023-07-23 NOTE — Patient Instructions (Addendum)
Your Plan:  Continue Sinemet Three times day. If you wanted to increase to four times a day spaced out by 4 hours- you could trial this.  Continue Mirapex three times a day.  Monitor memory If your symptoms worsen or you develop new symptoms please let us know.    Thank you for coming to see [place] at [facility]. I hope we have been able to provide you high quality care today.  You may receive a patient satisfaction survey over the next few weeks. We would appreciate your feedback and comments so that we may continue to improve ourselves and the health of our patients.

## 2023-07-30 ENCOUNTER — Other Ambulatory Visit: Payer: Self-pay | Admitting: Neurology

## 2024-03-09 ENCOUNTER — Encounter: Payer: Self-pay | Admitting: Neurology

## 2024-03-09 ENCOUNTER — Ambulatory Visit (INDEPENDENT_AMBULATORY_CARE_PROVIDER_SITE_OTHER): Admitting: Neurology

## 2024-03-09 VITALS — BP 126/74 | HR 67 | Ht 64.0 in | Wt 176.6 lb

## 2024-03-09 DIAGNOSIS — R413 Other amnesia: Secondary | ICD-10-CM

## 2024-03-09 DIAGNOSIS — K5909 Other constipation: Secondary | ICD-10-CM

## 2024-03-09 DIAGNOSIS — G20A2 Parkinson's disease without dyskinesia, with fluctuations: Secondary | ICD-10-CM

## 2024-03-09 NOTE — Progress Notes (Signed)
 Subjective:    Patient ID: Shelley Espinoza is a 83 y.o. female.  HPI    Interim history:   Shelley Espinoza is an 83 year old right-handed woman with an underlying medical history of hyperlipidemia, migraine headaches, arthritis with status post bilateral hip replacements, status post bilateral cataract extractions, overactive bladder, vertigo, and borderline obesity, who presents for follow-up consultation of her right sided Parkinson's disease.  She is unaccompanied today, she reports that her husband dropped her off.  She was last seen in our clinic by Colby Daub, NP in September 2024, at which time she had an MMSE of 23 out of 30.  She had remained fairly stable.  She was advised to continue with generic Sinemet  1 pill 3 times daily and continue with pramipexole  0.5 mg 3 times daily.  She was advised to continue to stay well-hydrated and exercise on a regular basis.  She was advised to trial Sinemet  4 times a day to see if it would help with her tremor flare.  She was taking valerian root and melatonin to help her sleep at night.   Today, 03/09/2024: She reports feeling stable, no significant issues with swallowing, some forgetfulness, particularly word finding issues are noted, chronic constipation for which she takes MiraLAX  as needed, not daily.  She has not had any falls but does report that her balance is not as good any longer and that she has particular trouble going down hill.  She does not typically use a walking aid.  She continues to take her Sinemet  and pramipexole  3 times a day.  She is not keen on trying to increase any of her medications.   The patient's allergies, current medications, family history, past medical history, past social history, past surgical history and problem list were reviewed and updated as appropriate.    Previously:  07/23/2023 Colby Daub, NP): <<Shelley Espinoza is a 83 y.o. female who has been followed in this office for Parkinson's disease. Returns  today for follow-up. At the lastr visit Sinemet  was increased to QID. But felt that she didn't need it so she went back to TID.  Continued Mirapex  TID.  Denies any changes with gait or balance. Able to walk a mile a day. Reports feeling off balance at times. No falls. No trouble chewing or swallowing food.  Denies vivid dreams but wakes up frequency to go to the bathroom. Taking valerian root and melatonin-feels this has been helpful for her sleep.  Continues to notice a prominent tremor in the right hand. >>  10/16/2022: She reports that she had a respiratory infection right after Thanksgiving.  She had to go to urgent care.  She reports that she was prescribed multiple medications including doxycycline for 10 days.  She was on Mucinex DM and Promethazine  DM and also took Delsym.  After reading and realizing that these medicines are not recommended for Parkinson's patients she stopped taking these about a week or so ago.  She has noticed a worsening tremor on the right side, she feels that her tremor used to be more intermittent and she could even calm it down at times but now it seems to be much more consistent.  She takes her Sinemet  and pramipexole  3 times a day, usually around 8 AM, 2 PM and 9 PM daily.  She still does not sleep very well, takes melatonin 20 mg at bedtime.  She is wondering if she can take anything else.  She has been improving with regards to the cough.  She  is trying to hydrate well and also added some electrolyte water  once a day.  She limits her caffeine to 1 cup of coffee in the morning and has scaled back on her wine intake, does not drink every day any longer and in fact has not had any alcohol since she got sick.  She felt dizzy.  She reports that her primary care prescribed meclizine which made her even more dizzy.  She has stopped taking it.  She has had intermittent blurry vision.  She has a history of glaucoma and saw her ophthalmologist.  She had laser surgery a couple months ago  and does not take any glaucoma drops any longer.  Her ophthalmologist did not think blurry vision was related to the glaucoma.  She denies any double vision or loss of vision.    I saw her on 06/26/2022, at which time she was advised to taper off trazodone .  She was advised to continue with pramipexole  and levodopa  3 times a day. She was advised to refrain from drinking alcohol daily and utilize melatonin as needed for sleep.   She called in early December 2023 reporting that she was more tremulous and felt really dizzy.  She had been to urgent care and was prescribed an antibiotic.  She was advised to follow-up with PCP.  She called back reporting that she called her PCP office and was advised to drink more electrolytes but was not seen.   I first met her on 12/26/2021, at which time she transitioned from Dr. Gisele Lamas care. She had features of right-sided predominant Parkinson's disease, complicated by chronic constipation, sleep disturbance and memory loss.  She was advised to taper off trazodone  as it was not helping her sleep.  She was advised to try melatonin.  She was advised to continue with pramipexole  0.5 mg 3 times daily and generic Sinemet  25-100 mg strength 1 pill 3 times daily.   12/26/2021: She reports feeling stable with regards to her Parkinson's but she does not sleep well.  She endorses stress what with her husband's recent surgery and he had a fall as well.  She lives with her husband, her son and daughter-in-law live half a mile away, her daughter lives in [place] .  She does not sleep through the night, she feels intermittently dizzy and off balance, has not had any recent falls.  Daughter-in-law reports that she has been found to be more forgetful in the past 3 to 4 months.  She has had more stress.  She has a longstanding history of difficulty sleeping.  She apparently tried high-dose Benadryl  in the past and also some melatonin, unclear of the dosing.  She has been on trazodone   200 mg at bedtime per Dr. Tilda Fogo.  Patient believes that it is not helping and she is suspecting that her balance issues are in part because of the trazodone , she feels that it makes her dizzy.  She tries to hydrate well.  In fact, in the past few days she started a new diet that includes 5 smaller meals and a lot of water .  She drinks alcohol on a regular basis, typically 1 or 2 glasses, up to 3 glasses of wine or liquor but reports that of the diet she is not currently consuming any alcohol.  She has constipation for which she takes MiraLAX  nearly daily.  She admits to being forgetful.  She is not able to give very many details about her history and some details are provided by her daughter-in-law.  She has had a right hand tremor dating back to early 2019.  She has no family history of Parkinson's disease.  She drinks caffeine in the form of coffee, 1 cup in the morning.  She quit smoking in the 70s. She previously followed by Dr. Lucienne Ryder and was last seen by him on 06/27/2021.  I reviewed the note and copied the note below for reference.  She has been on pramipexole  0.5 mg 3 times daily and levodopa  25-100 mg strength 1 pill 3 times daily.     06/27/2021 (Dr. Tilda Fogo): <<Ms. Bilotti is an 83 year old right-handed white female with a history of Parkinson's disease.  The patient has done well with her mobility, she continues to have a significant resting tremor with the right upper extremity.  She gives a longstanding history of positional vertigo, she has had to avoid sleeping on the right side of her head for many years because this would induce dizziness.  The patient however began having onset of some dizziness that began about 3 months ago.  The patient would wake up with dizziness that would last up to 2 days.  The dizziness has somewhat worsened over the last 2 months.  The patient has now started having brief episodes of significant dizziness that may occur without any head position change, this  may be associated with some graying out of vision.  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  The patient has not had any falls.  She has had to restrict some of her physical activity because of the dizziness.  She now notes that if she looks up or looks down dizziness will ensue.  She has not had any numbness or weakness of the face, arms, legs.  She denies any loss of vision or double vision or slurred speech.  She denies headaches.  She has not had any palpitations of the heart or chest pain.  She comes here for further evaluation. >>      Her Past Medical History Is Significant For: Past Medical History:  Diagnosis Date   Arthritis    Chronic insomnia 12/06/2019   Glaucoma    High cholesterol    Overactive bladder    Parkinson's disease (HCC) 11/18/2018   Tremor    right night    Her Past Surgical History Is Significant For: Past Surgical History:  Procedure Laterality Date   CATARACT EXTRACTION, BILATERAL     age 6   COSMETIC SURGERY     EYE SURGERY     martins nueroma     feet   TOTAL HIP ARTHROPLASTY Right 10/12/2019   Procedure: TOTAL HIP ARTHROPLASTY ANTERIOR APPROACH;  Surgeon: Claiborne Crew, MD;  Location: [facility];  Service: Orthopedics;  Laterality: Right;  70 mins   TOTAL HIP ARTHROPLASTY Left 01/18/2020   Procedure: TOTAL HIP ARTHROPLASTY ANTERIOR APPROACH;  Surgeon: Claiborne Crew, MD;  Location: [facility];  Service: Orthopedics;  Laterality: Left;    TUBAL LIGATION     VAGINAL DELIVERY      Her Family History Is Significant For: Family History  Problem Relation Age of Onset   Stroke Mother    Hypertension Father    Heart disease Father    Schizophrenia Sister    Parkinsonism Brother     Her Social History Is Significant For: Social History   Socioeconomic History   Marital status: Married    Spouse name: Shelley Espinoza   Number of children: 2   Years of  education: some college   Highest education level: Not on file   Occupational History   Not on file  Tobacco Use   Smoking status: Former    Current packs/day: 0.00    Types: Cigarettes    Quit date: 1980    Years since quitting: 45.3   Smokeless tobacco: Never   Tobacco comments:    Quit 1980   off and on for 10 years  Vaping Use   Vaping status: Never Used  Substance and Sexual Activity   Alcohol use: Yes    Alcohol/week: 7.0 standard drinks of alcohol    Types: 7 Standard drinks or equivalent per week    Comment: 1 cocktail before dinner-wine or scotch   Drug use: Never   Sexual activity: Not Currently  Other Topics Concern   Not on file  Social History Narrative   Lives with husband   Caffeine use: coffee 1 cup/day   Right handed    Social Drivers of Corporate investment banker Strain: Not on file  Food Insecurity: Not on file  Transportation Needs: Not on file  Physical Activity: Not on file  Stress: Not on file  Social Connections: Not on file    Her Allergies Are:  No Known Allergies:   Her Current Medications Are:  Outpatient Encounter Medications as of 03/09/2024  Medication Sig   Biotin 5000 MCG CAPS Take 1 capsule by mouth daily at 6 (six) AM.   Calcium Carb-Cholecalciferol (CALCIUM 600 + D PO) Take 1 tablet by mouth 2 (two) times daily.   carbidopa -levodopa  (SINEMET  IR) 25-100 MG tablet TAKE 1 TABLET THREE TIMES DAILY   Cholecalciferol (VITAMIN D) 50 MCG (2000 UT) CAPS Take 1 capsule by mouth daily.   latanoprost (XALATAN) 0.005 % ophthalmic solution Place 1 drop into both eyes at bedtime.   Melatonin 10 MG CAPS Take 20 mg by mouth at bedtime.   Multiple Vitamins-Minerals (MULTIVITAMIN PO) Take 1 tablet by mouth daily.   MYRBETRIQ 50 MG TB24 tablet Take 50 mg by mouth daily.   Omega-3 Fatty Acids (FISH OIL PO) Take 1 capsule by mouth daily.   polyethylene glycol (MIRALAX  / GLYCOLAX ) 17 g packet Take 17 g by mouth 2 (two) times daily. (Patient taking differently: Take 17 g by mouth daily.)   pramipexole  (MIRAPEX ) 0.5  MG tablet TAKE 1 TABLET THREE TIMES DAILY   simvastatin  (ZOCOR ) 20 MG tablet Take 20 mg by mouth every other day.    Valerian Root 500 MG CAPS Take 500 mg by mouth at bedtime.   vitamin B-12 (CYANOCOBALAMIN) 1000 MCG tablet Take 1,000 mcg by mouth daily.   vitamin C (ASCORBIC ACID) 500 MG tablet Take 500 mg by mouth daily.   No facility-administered encounter medications on file as of 03/09/2024.  :  Review of Systems:  Out of a complete 14 point review of systems, all are reviewed and negative with the exception of these symptoms as listed below:  Review of Systems  Neurological:        Pt is doing well, stable.  Has noted that when not taking sinemet   for day or one dose no difference in tremors.      Objective:  Neurological Exam  Physical Exam Physical Examination:   Vitals:   03/09/24 1302  BP: 126/74  Pulse: 67    General Examination: The patient is a very pleasant 83 y.o. female in no acute distress. She appears well-developed and well-nourished and well groomed.  Good spirits.  HEENT: Normocephalic, atraumatic, pupils are equal, round and reactive to light, extraocular tracking is fairly well-preserved, face is symmetric with mild facial masking, mild nuchal rigidity noted, no lip, neck or jaw tremor.  Airway examination reveals mild mouth dryness, tongue protrudes centrally and palate elevates symmetrically, no carotid bruits.  No significant hypophonia, no dysarthria.    Chest: Clear to auscultation without wheezing, rhonchi or crackles noted.   Heart: S1+S2+0, regular and normal without murmurs, rubs or gallops noted.    Abdomen: Soft, non-tender and non-distended.   Extremities: There is no pitting edema in the distal lower extremities bilaterally.    Skin: Warm and dry without trophic changes noted.    Musculoskeletal: exam reveals no obvious joint deformities.    Neurologically:  Mental status: The patient is awake, alert and able to give appropriate answers.   No dysarthria, mood is normal and affect is normal.  Thought process is linear.        06/26/2022   11:21 AM 06/03/2019   11:20 AM  MMSE - Mini Mental State Exam  Orientation to time 5 5  Orientation to Place 4 4  Registration 3 3  Attention/ Calculation 5 5  Recall 2 2  Language- name 2 objects 2 2  Language- repeat 1 1  Language- follow 3 step command 3 3  Language- read & follow direction 1 1  Write a sentence 1 1  Copy design 1 0  Total score 28 27      On 06/26/2022: CDT: 4/4, AFT: 9/min.   Cranial nerves II - XII are as described above under HEENT exam.  Motor exam: Normal bulk, global strength good for age, mild increase in tone in the right wrist, she has a fairly consistent right upper extremity resting tremor in the mild to moderate range, no other tremors noted, no significant postural tremor.  Tremor in her right hand is more pronounced, in the moderate range.  Fine motor skills are mildly impaired on the right upper extremity, and right lower extremity with foot agility but fairly normal on the left side.    Romberg is not tested due to safety concerns.   Cerebellar testing: No dysmetria or intention tremor. There is no truncal or gait ataxia.  Sensory exam: intact to light touch in the upper and lower extremities.  Gait, station and balance: She stands without difficulty and walks without a walking aid.  Decreased arm swing on the right more than left, slight insecurity with turns.   Assessment and Plan:  In summary, Shelley Espinoza is an 83 year old female with an underlying medical history of hyperlipidemia, migraine headaches, arthritis with status post bilateral hip replacements, status post bilateral cataract extractions, overactive bladder, vertigo, and borderline obesity, who presents for follow-up consultation of her right sided Parkinson's disease, complicated by chronic constipation, chronic sleep disturbance, and some memory loss, particularly forgetfulness.  She  has had exacerbation of her tremor since she had an upper respiratory infection in late November 2023.  We talked about the importance of gait safety and fall prevention.  She is encouraged to use a walking cane when she goes outside.  She is advised to stay well-hydrated and well rested.  She has been off of the trazodone .  She is advised to continue with Sinemet  to 1 pill 3 times daily.  She currently takes it around 8 AM, 2 or 3 PM, and between 8 and 10 PM.  We tried to increase her Sinemet  to 1 pill 4 times  a day but she did not find it beneficial and would rather like to stay on her current regimen including pramipexole  0.5 mg strength 3 times daily.  She is advised to follow-up to see one of our nurse practitioners routinely in 6 to 8 months, sooner if needed.  I answered all her questions today and she was in agreement.   I spent 30 minutes in total face-to-face time and in reviewing records during pre-charting, more than 50% of which was spent in counseling and coordination of care, reviewing test results, reviewing medications and treatment regimen and/or in discussing or reviewing the diagnosis of PD, the prognosis and treatment options. Pertinent laboratory and imaging test results that were available during this visit with the patient were reviewed by me and considered in my medical decision making (see chart for details).

## 2024-03-24 ENCOUNTER — Ambulatory Visit: Payer: Medicare PPO | Admitting: Adult Health

## 2024-09-27 ENCOUNTER — Telehealth: Payer: Self-pay | Admitting: Adult Health

## 2024-09-27 NOTE — Telephone Encounter (Signed)
 LVM and sent mychart msg informing pt of need to reschedule 10/18/24 appt - NP out

## 2024-09-28 ENCOUNTER — Other Ambulatory Visit: Payer: Self-pay | Admitting: Adult Health

## 2024-10-14 ENCOUNTER — Telehealth: Payer: Self-pay | Admitting: Neurology

## 2024-10-14 NOTE — Telephone Encounter (Signed)
 F/u reschduled with Dr Buck because she did not want to wait until Nov of '26 even with the wait list. Pt is aware of arrival time being 9:15 for her 9:45 appointment on 12/18

## 2024-10-18 ENCOUNTER — Ambulatory Visit: Admitting: Adult Health

## 2024-10-21 ENCOUNTER — Ambulatory Visit: Admitting: Neurology

## 2024-10-26 ENCOUNTER — Telehealth: Payer: Self-pay | Admitting: Neurology

## 2024-10-26 NOTE — Telephone Encounter (Signed)
 Patient stated, Last 3 days have not been able to sleep, focus is gone, dizziness. Patient would like a call back to discuss.

## 2024-10-26 NOTE — Telephone Encounter (Signed)
 I think you have covered all the bases, I would definitely recommend PCP evaluation first and if acutely worse, needs to go to the ED.  As already stated, avoid all Benadryl  and p.m. type medications.

## 2024-10-26 NOTE — Telephone Encounter (Signed)
 I called pt and she is experiencing lightheadedness for the last 3 days. (No worse over the course).   No nausea.  (Last all day long).  She feels out of focus, can't sleep.  She says her tremor in her hand worse as well.  She seems more sensitive.  She has had dizziness (positional before and its not that).  I relayed that dizziness is can be cause by many different things.  Hydration, blood sugar, metabolic, Bp.  I told her she can see pcp for evaluation.  Her appt with use 02/2025 is on waitllist.  If things worsen acutely seek care urgent care/ED.  She asked about other sleep aides otc. (She currently takes valerian root and melatonin).  I told her no BENADRYL .  She appreciated call.  I told her would forward to Dr. Buck who is out and may not see until next week.

## 2024-10-31 ENCOUNTER — Telehealth: Payer: Self-pay | Admitting: Neurology

## 2024-10-31 NOTE — Telephone Encounter (Signed)
 Patient called the answering service reporting increase tremors related to the Parkinson disease. She is already on Sinemet  25/100 TID. I have ask her to increase to 4 times daily and contact her treating neurologist Monday morning for further recommendations.    Dr. Kilah Drahos

## 2024-11-01 MED ORDER — CARBIDOPA-LEVODOPA 25-100 MG PO TABS: 1.0000 | ORAL_TABLET | Freq: Four times a day (QID) | ORAL | 1 refills | Status: AC

## 2024-11-01 NOTE — Telephone Encounter (Signed)
 Patient has been notified and will gradually move back down to 10mg  for the melatonin.

## 2024-11-01 NOTE — Telephone Encounter (Signed)
 FYI: So long as she tolerates Sinemet  1 pill 4 times daily, she can continue with it (in case she calls back).

## 2024-11-01 NOTE — Addendum Note (Signed)
 Addended by: NEYSA NENA RAMAN on: 11/01/2024 01:44 PM   Modules accepted: Orders

## 2024-11-01 NOTE — Telephone Encounter (Signed)
 Pt has called reporting that on Monday middle of night she woke up breathing heavy, happened again on Tues of last week middle of the day, pt said her blood pressure did not go up on either event.  Pt believes she needs an increase on her general parkinson's medication, also on her Melatonin and her  Valerian Root 500 MG CAPS , please call pt to discuss her request.

## 2024-11-01 NOTE — Telephone Encounter (Signed)
 I called pt.  She stated that she is doing well.with the carbidopa  levodopa  1 tab qid, she is having sleeping issue will go to sleep 1030-11pm then awaken 0400 and not able to go back to sleep.   She is asking for additional tablet on her melatonin 20mg  nightly and valerian root. 200mg  nightly.   She has appt 02-09-2025.

## 2024-11-01 NOTE — Telephone Encounter (Signed)
 Patient states she is confused prior to her last visit she was told she could take 20mg  for the melatonin and she has been since she was last seen. Patient states backing down to 10mg  will cause her to have horrible side effects like palpitations. Please advise if she can continue 20mg  of melatonin.   [employer] Valerian Root 590 mg - Per the bottle takes 2 soft gels at night (30 minutes prior to bedtime) as a serving size of 590 mg

## 2024-11-01 NOTE — Telephone Encounter (Signed)
 At the last visit with me we did not discuss the dosing of her melatonin.  I do not usually recommend high dose melatonin because it can cause grogginess and increased fall risk and increased confusion.  If possible I would recommend that she gradually reduce it from 20 mg to 10 mg.

## 2024-11-01 NOTE — Telephone Encounter (Signed)
 She can take an over-the-counter valerian root supplement as per instructions on the bottle but I would not recommend increasing melatonin more than 10 mg at night.

## 2025-02-09 ENCOUNTER — Ambulatory Visit: Admitting: Neurology
